# Patient Record
Sex: Female | Born: 1995 | Hispanic: Yes | Smoking: Never smoker
Health system: Southern US, Community
[De-identification: ages and names within clinical notes are randomized; demographics above are authoritative.]

## PROBLEM LIST (undated history)

## (undated) DIAGNOSIS — R519 Headache, unspecified: Secondary | ICD-10-CM

## (undated) DIAGNOSIS — Z789 Other specified health status: Secondary | ICD-10-CM

## (undated) DIAGNOSIS — F419 Anxiety disorder, unspecified: Secondary | ICD-10-CM

## (undated) HISTORY — PX: HERNIA REPAIR: SHX51

## (undated) HISTORY — PX: TONSILLECTOMY: SUR1361

## (undated) HISTORY — PX: NO PAST SURGERIES: SHX2092

## (undated) HISTORY — PX: WISDOM TOOTH EXTRACTION: SHX21

---
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
## 2019-05-01 LAB — OB RESULTS CONSOLE HEPATITIS B SURFACE ANTIGEN: Hepatitis B Surface Ag: NEGATIVE

## 2019-05-01 LAB — OB RESULTS CONSOLE VARICELLA ZOSTER ANTIBODY, IGG: Varicella: NON-IMMUNE/NOT IMMUNE

## 2019-05-01 LAB — OB RESULTS CONSOLE RUBELLA ANTIBODY, IGM: Rubella: IMMUNE

## 2019-09-13 NOTE — L&D Delivery Note (Signed)
Delivery Note At 1:58 AM a viable and healthy female "Hannah Hogan" was delivered via Vaginal, Spontaneous (Presentation:  LOA  ).  APGAR: 8, 9; weight pending .   Placenta status: Spontaneous, Intact.  Cord: 3 vessels with the following complications: Nuchal cord, loose, x1.   Anesthesia: None Episiotomy: None Lacerations: Periurethral Suture Repair: n/a Est. Blood Loss (mL): 250  Mom to postpartum.  Baby to Couplet care / Skin to Skin.  23yo G2P1001 at 39+5wks admitted in active labor. AROM for clear fluid, pitocin at 85mu/min. Delivered over intact perineum with loose nuchal cord reduced at perineum. Placenta delivered spontaneously, intact. Bleeding minimal, small periurethral tear not requiring suture. Fundus firm, baby to maternal abdomen, doing well.  Christeen Douglas 11/21/2019, 2:42 AM

## 2019-10-28 LAB — OB RESULTS CONSOLE RPR: RPR: NONREACTIVE

## 2019-10-28 LAB — OB RESULTS CONSOLE HIV ANTIBODY (ROUTINE TESTING): HIV: NONREACTIVE

## 2019-10-28 LAB — OB RESULTS CONSOLE GBS: GBS: NEGATIVE

## 2019-10-28 LAB — OB RESULTS CONSOLE GC/CHLAMYDIA
Chlamydia: NEGATIVE
Gonorrhea: NEGATIVE

## 2019-11-20 ENCOUNTER — Inpatient Hospital Stay
Admission: EM | Admit: 2019-11-20 | Discharge: 2019-11-22 | DRG: 807 | Disposition: A | Discharge status: Home / Self Care | Payer: Medicaid Other | Attending: Obstetrics and Gynecology | Admitting: Obstetrics and Gynecology

## 2019-11-20 ENCOUNTER — Other Ambulatory Visit: Payer: Self-pay

## 2019-11-20 DIAGNOSIS — Z20822 Contact with and (suspected) exposure to covid-19: Secondary | ICD-10-CM | POA: Diagnosis present

## 2019-11-20 DIAGNOSIS — Z3A39 39 weeks gestation of pregnancy: Secondary | ICD-10-CM | POA: Diagnosis not present

## 2019-11-20 DIAGNOSIS — O26893 Other specified pregnancy related conditions, third trimester: Secondary | ICD-10-CM | POA: Diagnosis present

## 2019-11-20 DIAGNOSIS — Z6791 Unspecified blood type, Rh negative: Secondary | ICD-10-CM | POA: Diagnosis not present

## 2019-11-20 DIAGNOSIS — Z23 Encounter for immunization: Secondary | ICD-10-CM

## 2019-11-20 DIAGNOSIS — Z3493 Encounter for supervision of normal pregnancy, unspecified, third trimester: Secondary | ICD-10-CM

## 2019-11-20 LAB — CBC
HCT: 39.7 % (ref 36.0–46.0)
Hemoglobin: 13.4 g/dL (ref 12.0–15.0)
MCH: 28.8 pg (ref 26.0–34.0)
MCHC: 33.8 g/dL (ref 30.0–36.0)
MCV: 85.4 fL (ref 80.0–100.0)
Platelets: 259 10*3/uL (ref 150–400)
RBC: 4.65 MIL/uL (ref 3.87–5.11)
RDW: 14 % (ref 11.5–15.5)
WBC: 6.7 10*3/uL (ref 4.0–10.5)
nRBC: 0 % (ref 0.0–0.2)

## 2019-11-20 LAB — ABO/RH: ABO/RH(D): O NEG

## 2019-11-20 LAB — TYPE AND SCREEN
ABO/RH(D): O NEG
Antibody Screen: NEGATIVE

## 2019-11-20 LAB — RESPIRATORY PANEL BY RT PCR (FLU A&B, COVID)
Influenza A by PCR: NEGATIVE
Influenza B by PCR: NEGATIVE
SARS Coronavirus 2 by RT PCR: NEGATIVE

## 2019-11-20 LAB — RAPID HIV SCREEN (HIV 1/2 AB+AG)
HIV 1/2 Antibodies: NONREACTIVE
HIV-1 P24 Antigen - HIV24: NONREACTIVE

## 2019-11-20 MED ORDER — CALCIUM CARBONATE ANTACID 500 MG PO CHEW
4.0000 | CHEWABLE_TABLET | Freq: Every day | ORAL | Status: DC
  Filled 2019-11-20 (×2): qty 4

## 2019-11-20 MED ORDER — LACTATED RINGERS IV SOLN
INTRAVENOUS | Status: DC
  Administered 2019-11-20: 999 mL via INTRAVENOUS

## 2019-11-20 MED ORDER — CALCIUM CARBONATE ANTACID 500 MG PO CHEW
CHEWABLE_TABLET | ORAL | Status: AC
  Administered 2019-11-20: 800 mg via ORAL
  Filled 2019-11-20: qty 4

## 2019-11-20 MED ORDER — OXYTOCIN 40 UNITS IN NORMAL SALINE INFUSION - SIMPLE MED
1.0000 m[IU]/min | INTRAVENOUS | Status: DC
  Administered 2019-11-20: 2 m[IU]/min via INTRAVENOUS
  Filled 2019-11-20: qty 1000

## 2019-11-20 MED ORDER — BUTORPHANOL TARTRATE 1 MG/ML IJ SOLN
1.0000 mg | INTRAMUSCULAR | Status: DC | PRN
  Administered 2019-11-21: 1 mg via INTRAVENOUS
  Filled 2019-11-20: qty 1

## 2019-11-20 MED ORDER — OXYTOCIN 40 UNITS IN NORMAL SALINE INFUSION - SIMPLE MED: 2.5000 [IU]/h | INTRAVENOUS | Status: DC

## 2019-11-20 MED ORDER — ONDANSETRON HCL 4 MG/2ML IJ SOLN: 4.0000 mg | Freq: Four times a day (QID) | INTRAMUSCULAR | Status: DC | PRN

## 2019-11-20 MED ORDER — LIDOCAINE HCL (PF) 1 % IJ SOLN: 30.0000 mL | INTRAMUSCULAR | Status: DC | PRN

## 2019-11-20 MED ORDER — ACETAMINOPHEN 325 MG PO TABS: 650.0000 mg | ORAL_TABLET | ORAL | Status: DC | PRN

## 2019-11-20 MED ORDER — LACTATED RINGERS IV SOLN: 500.0000 mL | INTRAVENOUS | Status: DC | PRN

## 2019-11-20 MED ORDER — OXYTOCIN BOLUS FROM INFUSION
500.0000 mL | Freq: Once | INTRAVENOUS | Status: AC
  Administered 2019-11-21: 500 mL via INTRAVENOUS

## 2019-11-20 MED ORDER — SOD CITRATE-CITRIC ACID 500-334 MG/5ML PO SOLN: 30.0000 mL | ORAL | Status: DC | PRN

## 2019-11-20 NOTE — Progress Notes (Signed)
Patient ID: Hannah Hogan, female   DOB: April 16, 1996, 25 y.o.   MRN: [id number]  Pt interested in dinner, then as natural a birth as possible. Plan for AROM after food, with ambulation and birthing ball and alternative labor positions PRN.

## 2019-11-20 NOTE — H&P (Signed)
OB ADMISSION/ HISTORY & PHYSICAL:  Admission Date: 11/20/2019  4:46 PM  Admit Diagnosis: Vaginal bleeding, contractions  Hannah Hogan is a 24 y.o. G2P1001 presenting for contractions.  Prenatal History: G2P1001   EDC : 11/23/2019, by Last Menstrual Period  [facility] at [facility] Prenatal course complicated by  - 1. RH negative  Rhogam 09/03/19 2. Prepregnancy BMI 38.7  Baseline CMP and P/C ratio wnl, early GTT: 87, early A1c: 5.3  Discussed total wt gain 10-15lbs 3. Varicella non immune  vaccinate post partum   Medical / Surgical History :  Past medical history: History reviewed. No pertinent past medical history.   Past surgical history: History reviewed. No pertinent surgical history.  Family History: History reviewed. No pertinent family history.   Social History:  reports that she has never smoked. She has never used smokeless tobacco. No history on file for alcohol and drug.   Allergies: Patient has no known allergies.    Current Medications at time of admission:  Prior to Admission medications   Not on File     Review of Systems: Active FM  LOF  / SROM: intact bloody show : yes   Physical Exam:  VS: Blood pressure (!) 110/59, pulse 96, temperature 99.2 F (37.3 C), temperature source Oral, resp. rate 12, last menstrual period 02/16/2019.  General: alert and oriented, appears NAD Heart: RRR Lungs: Clear lung fields Abdomen: Gravid, soft and non-tender, non-distended Extremities:  edema  FHT: 130, moderate variability, +accels, no decels TOCO: q4 min SVE:  Dilation: 4 / Effacement (%): 60 / Station: -1    Cephalic by leopolds  Prenatal Labs: Blood type/Rh   O negative  Antibody screen neg  Rubella Immune  Varicella NON- Immune  RPR NR  HBsAg Neg  HIV NR  GC neg  Chlamydia neg  Genetic screening negative  1 hour GTT 133  3 hour GTT n/a  GBS neg   No results found.  Assessment: [redacted]w[redacted]d weeks gestation 1 stage of  labor FHR category 1   Plan:  Admit for active labor Labs pending Epidural when desired Continuous fetal monitoring   1. Fetal Well being  - Fetal Tracing: cat I - Ultrasound:  reviewed, as above - Group B Streptococcus: neg - Presentation: vtx confirmed by Leopolds   2. Routine OB: - Prenatal labs reviewed, as above - Rh neg  3. Post Partum Planning: - Infant feeding: breast - Contraception: undecided

## 2019-11-20 NOTE — OB Triage Note (Signed)
Pt c/o vaginal bleeding that is dark in color and mucous like when she wipes.

## 2019-11-20 NOTE — Progress Notes (Signed)
   Cat I strip AROM for clear fluid Significant bloody show Contractions q 3-5 min Dilation: 4.5 Effacement (%): 60 Cervical Position: Middle Station: -1 Presentation: Vertex Exam by:: [employer] pitocin at 2 and up by 1

## 2019-11-21 ENCOUNTER — Encounter: Payer: Self-pay | Admitting: Obstetrics and Gynecology

## 2019-11-21 LAB — CBC
HCT: 37.2 % (ref 36.0–46.0)
Hemoglobin: 12.6 g/dL (ref 12.0–15.0)
MCH: 29.2 pg (ref 26.0–34.0)
MCHC: 33.9 g/dL (ref 30.0–36.0)
MCV: 86.1 fL (ref 80.0–100.0)
Platelets: 246 10*3/uL (ref 150–400)
RBC: 4.32 MIL/uL (ref 3.87–5.11)
RDW: 13.7 % (ref 11.5–15.5)
WBC: 15.8 10*3/uL — ABNORMAL HIGH (ref 4.0–10.5)
nRBC: 0 % (ref 0.0–0.2)

## 2019-11-21 LAB — FETAL SCREEN: Fetal Screen: NEGATIVE

## 2019-11-21 LAB — RPR: RPR Ser Ql: NONREACTIVE

## 2019-11-21 MED ORDER — ACETAMINOPHEN 325 MG PO TABS: 650.0000 mg | ORAL_TABLET | ORAL | Status: DC | PRN

## 2019-11-21 MED ORDER — RHO D IMMUNE GLOBULIN 1500 UNIT/2ML IJ SOSY
300.0000 ug | PREFILLED_SYRINGE | Freq: Once | INTRAMUSCULAR | Status: AC
  Administered 2019-11-21: 300 ug via INTRAVENOUS
  Filled 2019-11-21: qty 2

## 2019-11-21 MED ORDER — ZOLPIDEM TARTRATE 5 MG PO TABS: 5.0000 mg | ORAL_TABLET | Freq: Every evening | ORAL | Status: DC | PRN

## 2019-11-21 MED ORDER — LORATADINE 10 MG PO TABS
10.0000 mg | ORAL_TABLET | Freq: Every day | ORAL | Status: DC | PRN
  Administered 2019-11-21: 10 mg via ORAL
  Filled 2019-11-21 (×2): qty 1

## 2019-11-21 MED ORDER — SENNOSIDES-DOCUSATE SODIUM 8.6-50 MG PO TABS
2.0000 | ORAL_TABLET | ORAL | Status: DC
  Administered 2019-11-22: 2 via ORAL
  Filled 2019-11-21: qty 2

## 2019-11-21 MED ORDER — SODIUM CHLORIDE 0.9 % IV SOLN: 250.0000 mL | INTRAVENOUS | Status: DC | PRN

## 2019-11-21 MED ORDER — MEASLES, MUMPS & RUBELLA VAC IJ SOLR
0.5000 mL | Freq: Once | INTRAMUSCULAR | Status: DC
  Filled 2019-11-21: qty 0.5

## 2019-11-21 MED ORDER — BISACODYL 10 MG RE SUPP: 10.0000 mg | Freq: Every day | RECTAL | Status: DC | PRN

## 2019-11-21 MED ORDER — SIMETHICONE 80 MG PO CHEW: 80.0000 mg | CHEWABLE_TABLET | ORAL | Status: DC | PRN

## 2019-11-21 MED ORDER — WITCH HAZEL-GLYCERIN EX PADS: 1.0000 "application " | MEDICATED_PAD | CUTANEOUS | Status: DC | PRN

## 2019-11-21 MED ORDER — BENZOCAINE-MENTHOL 20-0.5 % EX AERO
1.0000 "application " | INHALATION_SPRAY | CUTANEOUS | Status: DC | PRN
  Filled 2019-11-21: qty 56

## 2019-11-21 MED ORDER — SODIUM CHLORIDE 0.9% FLUSH: 3.0000 mL | Freq: Two times a day (BID) | INTRAVENOUS | Status: DC

## 2019-11-21 MED ORDER — DIPHENHYDRAMINE HCL 25 MG PO CAPS: 25.0000 mg | ORAL_CAPSULE | Freq: Four times a day (QID) | ORAL | Status: DC | PRN

## 2019-11-21 MED ORDER — COCONUT OIL OIL
1.0000 "application " | TOPICAL_OIL | Status: DC | PRN
  Administered 2019-11-21: 1 via TOPICAL
  Filled 2019-11-21: qty 120

## 2019-11-21 MED ORDER — IBUPROFEN 600 MG PO TABS
600.0000 mg | ORAL_TABLET | Freq: Four times a day (QID) | ORAL | Status: DC
  Administered 2019-11-21 – 2019-11-22 (×6): 600 mg via ORAL
  Filled 2019-11-21 (×6): qty 1

## 2019-11-21 MED ORDER — OXYCODONE HCL 5 MG PO TABS: 5.0000 mg | ORAL_TABLET | ORAL | Status: DC | PRN

## 2019-11-21 MED ORDER — FLEET ENEMA 7-19 GM/118ML RE ENEM: 1.0000 | ENEMA | Freq: Every day | RECTAL | Status: DC | PRN

## 2019-11-21 MED ORDER — ONDANSETRON HCL 4 MG PO TABS: 4.0000 mg | ORAL_TABLET | ORAL | Status: DC | PRN

## 2019-11-21 MED ORDER — SODIUM CHLORIDE 0.9% FLUSH: 3.0000 mL | INTRAVENOUS | Status: DC | PRN

## 2019-11-21 MED ORDER — DIBUCAINE (PERIANAL) 1 % EX OINT: 1.0000 "application " | TOPICAL_OINTMENT | CUTANEOUS | Status: DC | PRN

## 2019-11-21 MED ORDER — ONDANSETRON HCL 4 MG/2ML IJ SOLN: 4.0000 mg | INTRAMUSCULAR | Status: DC | PRN

## 2019-11-21 MED ORDER — TETANUS-DIPHTH-ACELL PERTUSSIS 5-2.5-18.5 LF-MCG/0.5 IM SUSP: 0.5000 mL | Freq: Once | INTRAMUSCULAR | Status: DC

## 2019-11-21 MED ORDER — PRENATAL MULTIVITAMIN CH
1.0000 | ORAL_TABLET | Freq: Every day | ORAL | Status: DC
  Administered 2019-11-21 – 2019-11-22 (×2): 1 via ORAL
  Filled 2019-11-21 (×2): qty 1

## 2019-11-21 NOTE — Lactation Note (Signed)
This note was copied from a baby's chart. Lactation Consultation Note  Patient Name: Hannah Hogan [id number] Date: 11/21/2019 Reason for consult: Initial assessment  Infant laying in bassinet when [facility] student entered the room. Infant started showing hunger cues in the bassinet and LC student offered to assist with latch.  MOB reported cracking on both breast and bleeding on right breast. Reported hx of breastfeeding with almost 23 yo child and goal is to pump milk for this child in a few months when she is separated from baby for her schooling.  LC student provided education re: cluster feeding, importance of proper positioning, wide open mouth, baby's body alignment, normal newborn behavior and normalized no pain.  MOB knows to call LC, PRN.   Maternal Data Formula Feeding for Exclusion: No Has patient been taught Hand Expression?: Yes Does the patient have breastfeeding experience prior to this delivery?: Yes  Feeding Feeding Type: Breast Fed  LATCH Score Latch: Repeated attempts needed to sustain latch, nipple held in mouth throughout feeding, stimulation needed to elicit sucking reflex.  Audible Swallowing: Spontaneous and intermittent  Type of Nipple: Flat  Comfort (Breast/Nipple): Filling, red/small blisters or bruises, mild/mod discomfort  Hold (Positioning): Assistance needed to correctly position infant at breast and maintain latch.  LATCH Score: 6  Interventions Interventions: Breast feeding basics reviewed;Assisted with latch;Skin to skin;Comfort gels;Position options;Support pillows  Lactation Tools Discussed/Used Tools: Comfort gels   Consult Status Consult Status: Follow-up Follow-up type: In-patient    Carlena Hurl 11/21/2019, 11:55 AM

## 2019-11-21 NOTE — Discharge Summary (Signed)
Obstetrical Discharge Summary  Patient Name: Hannah Hogan DOB: 1995-10-05 MRN: [id number]  Date of Admission: 11/20/2019 Date of Discharge: 11/22/2019  Primary OB: [facility]   Gestational Age at Delivery: [redacted]w[redacted]d   Antepartum complications:   - 1. RH negative  Rhogam 09/03/19 2. Prepregnancy BMI 38.7  Baseline CMP and P/C ratio wnl, early GTT: 87, early A1c: 5.3  Discussed total wt gain 10-15lbs 3. Varicella non immune  vaccinate post partum  Admitting Diagnosis: labor Secondary Diagnosis: Patient Active Problem List   Diagnosis Date Noted  . Supervision of normal pregnancy in third trimester 11/20/2019    Augmentation: AROM and Pitocin Complications: None Intrapartum complications/course: 62WE G2P1001 at 39+5wks admitted in active labor. AROM for clear fluid, pitocin at 4mu/min. Delivered over intact perineum with loose nuchal cord reduced at perineum. Placenta delivered spontaneously, intact. Bleeding minimal, small periurethral tear not requiring suture. Fundus firm, baby to maternal abdomen, doing well.  Date of Delivery: 11/21/2019 Delivered By: Benjaman Kindler  Delivery Type: spontaneous vaginal delivery Anesthesia: none Placenta: Spontaneous Laceration: small Episiotomy: none Newborn Data: Live born female "Hannah Hogan" Birth Weight: 4240g  APGAR: 8, 9  Newborn Delivery   Birth date/time: 11/21/2019 01:58:00 Delivery type: Vaginal, Spontaneous      Brief Hospital Course  Hannah Hogan is a G2P1001 who had a SVD on 11/21/2019;  for further details of this delivery, please refer to the delivery note.  Patient had an uncomplicated postpartum course.  By time of discharge on PPD#1, her pain was controlled on oral pain medications; she had appropriate lochia and was ambulating, voiding without difficulty and tolerating regular diet.  She was deemed stable for discharge to home.    Discharge Physical Exam:  BP (!) 91/53 (BP Location:  Left Arm)   Pulse 77   Temp 98 F (36.7 C) (Oral)   Resp 18   Ht 5\' 3"  (1.6 m)   Wt 111.1 kg   LMP 02/16/2019   SpO2 99%   Breastfeeding Unknown   BMI 43.40 kg/m   General: NAD CV: RRR Pulm: CTABL, nl effort ABD: s/nd/nt, fundus firm and below the umbilicus Lochia: moderate DVT Evaluation: LE non-ttp, no evidence of DVT on exam.  Hemoglobin  Date Value Ref Range Status  11/22/2019 11.8 (L) 12.0 - 15.0 g/dL Final   HGB  Date Value Ref Range Status  01/17/2013 13.0 12.0 - 16.0 g/dL Final   HCT  Date Value Ref Range Status  11/22/2019 35.9 (L) 36.0 - 46.0 % Final  01/17/2013 38.1 35.0 - 47.0 % Final    Postpartum Procedures: Varicella ordered to be given Disposition: stable, discharge to home. Baby Feeding: breastmilk Baby Disposition: home with mom  Rh Immune globulin given: 11/21/2019 Rubella vaccine given: n/a   Contraception: undecided  Prenatal Labs: Blood type/Rh --/--/O NEG Performed at [facility], [address] [place], [place] [address]  [phone])  Antibody screen neg  Rubella Immune  Varicella NON-Immune  RPR NR  HBsAg Neg  HIV NR  GC neg  Chlamydia neg  Genetic screening negative  1 hour GTT 133  3 hour GTT n/a  GBS neg     Plan:  Jamal Maes was discharged to home in good condition. Follow-up appointment at [facility]  with delivering provider in 4-6 weeks   Discharge Medications: Allergies as of 11/22/2019   No Known Allergies     Medication List    TAKE these medications   acetaminophen 325 MG tablet  Commonly known as: TYLENOL Take 2 tablets (650 mg total) by mouth every 4 (four) hours as needed for mild pain or moderate pain.   ibuprofen 600 MG tablet Commonly known as: ADVIL Take 1 tablet (600 mg total) by mouth every 6 (six) hours as needed for mild pain, moderate pain or cramping.   prenatal multivitamin Tabs tablet Take 1 tablet by mouth daily at 12 noon.        Follow-up Information    Christeen Douglas, MD. Schedule an appointment as soon as possible for a visit in 4 week(s).   Specialty: Obstetrics and Gynecology Why: For routine postpartum visit in 4-6 weeks Contact information: [address] [place] [address] [phone]           Signed: Genia Del, CNM 11/22/2019 10:26 AM

## 2019-11-21 NOTE — Progress Notes (Signed)
Post Partum Day 0  Subjective: Doing well, no concerns. Ambulating without difficulty, pain managed with PO meds, tolerating regular diet, and voiding without difficulty.   No fever/chills, chest pain, shortness of breath, nausea/vomiting, or leg pain. No nipple or breast pain. \  Objective: BP (!) 98/54 (BP Location: Right Arm) Comment: nurse Kelvin Cellar notified  Pulse 83   Temp 98.7 F (37.1 C) (Oral)   Resp 20   Ht 5\' 3"  (1.6 m)   Wt 111.1 kg   LMP 02/16/2019   SpO2 100%   Breastfeeding Unknown   BMI 43.40 kg/m    Physical Exam:  General: alert and cooperative Breasts: soft, with nipple abrasions/excoriation CV: RRR Pulm: nl effort, CTABL Abdomen: soft, non-tender, active bowel sounds Uterine Fundus: firm Incision: n/a Perineum: minimal edema, lacerations hemostatic, repair well approximated Lochia: appropriate DVT Evaluation: No evidence of DVT seen on physical exam.  Recent Labs    11/20/19 1741 11/21/19 0446  HGB 13.4 12.6  HCT 39.7 37.2  WBC 6.7 15.8*  PLT 259 246    Assessment/Plan: 24 y.o. G2P2002 postpartum day # 0  -Continue routine postpartum care -Lactation consult needed for breastfeeding   -Immunization status: Needs varicella, prior to discharge  Disposition: Continue inpatient postpartum care    LOS: 1 day   Nalayah Hitt, CNM 11/21/2019, 7:55 AM

## 2019-11-22 LAB — RHOGAM INJECTION: Unit division: 0

## 2019-11-22 LAB — CBC
HCT: 35.9 % — ABNORMAL LOW (ref 36.0–46.0)
Hemoglobin: 11.8 g/dL — ABNORMAL LOW (ref 12.0–15.0)
MCH: 28.6 pg (ref 26.0–34.0)
MCHC: 32.9 g/dL (ref 30.0–36.0)
MCV: 87.1 fL (ref 80.0–100.0)
Platelets: 217 K/uL (ref 150–400)
RBC: 4.12 MIL/uL (ref 3.87–5.11)
RDW: 14.5 % (ref 11.5–15.5)
WBC: 7.6 K/uL (ref 4.0–10.5)
nRBC: 0 % (ref 0.0–0.2)

## 2019-11-22 MED ORDER — ACETAMINOPHEN 325 MG PO TABS: 650.0000 mg | ORAL_TABLET | ORAL | Status: DC | PRN

## 2019-11-22 MED ORDER — INFLUENZA VAC SPLIT QUAD 0.5 ML IM SUSY: 0.5000 mL | PREFILLED_SYRINGE | INTRAMUSCULAR | Status: DC

## 2019-11-22 MED ORDER — IBUPROFEN 600 MG PO TABS: 600.0000 mg | ORAL_TABLET | Freq: Four times a day (QID) | ORAL | 0 refills | Status: DC | PRN

## 2019-11-22 MED ORDER — VARICELLA VIRUS VACCINE LIVE 1350 PFU/0.5ML IJ SUSR
0.5000 mL | Freq: Once | INTRAMUSCULAR | Status: AC
  Administered 2019-11-22: 0.5 mL via SUBCUTANEOUS
  Filled 2019-11-22 (×2): qty 0.5

## 2019-11-22 MED ORDER — PRENATAL MULTIVITAMIN CH: 1.0000 | ORAL_TABLET | Freq: Every day | ORAL | Status: DC

## 2019-11-22 MED ORDER — INFLUENZA VAC SPLIT QUAD 0.5 ML IM SUSY
0.5000 mL | PREFILLED_SYRINGE | INTRAMUSCULAR | Status: AC
  Administered 2019-11-22: 0.5 mL via INTRAMUSCULAR
  Filled 2019-11-22: qty 0.5

## 2019-11-22 NOTE — Lactation Note (Addendum)
This note was copied from a baby's chart. Lactation Consultation Note  Patient Name: Hannah Hogan [id number] Date: 11/22/2019 Reason for consult: Follow-up assessment   Maternal Data Formula Feeding for Exclusion: No Does the patient have breastfeeding experience prior to this delivery?: Yes  Feeding Feeding Type: Breast Fed Baby latches easily to breast, nipple pain decreased with finger pressure on chin  LATCH Score Latch: Grasps breast easily, tongue down, lips flanged, rhythmical sucking.  Audible Swallowing: Spontaneous and intermittent  Type of Nipple: Everted at rest and after stimulation  Comfort (Breast/Nipple): Filling, red/small blisters or bruises, mild/mod discomfort  Hold (Positioning): No assistance needed to correctly position infant at breast.  LATCH Score: 9  Interventions Interventions: Breast compression;Adjust position  Lactation Tools Discussed/Used  mom to continue to adjust baby's latch to decrease pain on latch, coconut oil for soreness as needed  Consult Status Consult Status: Complete Date: 11/22/19 Follow-up type: In-patient    Dyann Kief 11/22/2019, 11:06 AM

## 2019-11-22 NOTE — Progress Notes (Signed)
Pt discharged with infant.  Discharge instructions, prescriptions and follow up appointment given to and reviewed with pt. Pt verbalized understanding. Escorted out by staff. 

## 2019-11-22 NOTE — Discharge Instructions (Signed)
Breastfeeding and Cracked or Sore Nipples It is normal to have some tenderness in your nipples when you start to breastfeed your new baby. Your nipples can also become cracked or sore. This may happen if your baby or the baby's mouth is not in the right position when breastfeeding. There are things you can do to help avoid these problems. Follow these instructions at home: Breastfeeding strategy   Make sure your baby's mouth attaches to your nipple (latches) properly to breastfeed.  Make sure your baby is in the right position when breastfeeding. Try different positions to find one that works.  Have your baby feed from the less sore breast first.  Break the latch between your baby's mouth and your nipple before removing him or her from your breast. To do this: ? Put your little finger in between your nipple and your baby's gums. If you use a breast pump:  Make sure the part of the pump that goes over your nipple fits properly.  Start by setting the pump to a low setting. Increase the pump strength over time as needed. Too high of a setting may cause damage to your nipple. Breast care To help your breasts and nipples stay healthy:  Avoid the use of soap on your nipples.  Wear a supportive bra. Avoid wearing underwire bras or tight bras.  Air-dry your nipples for 3-4 minutes after each feeding.  Use only cotton bra pads to soak up any breast milk that leaks. Be sure to change the pads if they become soaked with milk.  Put some lanolin on your nipples after breastfeeding. Pure lanolin does not need to be washed off your nipple before you feed your baby again. Pure lanolin is not harmful to your baby.  Rub some breast milk into your nipples: ? Use your hand to squeeze out a few drops of breast milk. ? Gently massage the milk into your nipples. ? Let your nipples air-dry. Contact a doctor if:  You have nipple pain.  You have soreness or cracking that lasts more than 1  week. Summary  It is normal to have some tenderness in your nipples when you start to breastfeed your new baby. But you should contact your doctor if you have nipple pain.  Your nipples can become cracked or sore if your baby's mouth does not attach to your nipple properly when breastfeeding.  Rub lanolin or breast milk onto your nipples to keep them from getting cracked or sore. Avoid washing your nipples with soap. This information is not intended to replace advice given to you by your health care provider. Make sure you discuss any questions you have with your health care provider. Document Revised: 12/19/2018 Document Reviewed: 04/06/2017 Elsevier Patient Education  2020 [employer]. Breastfeeding Tips for a Good Latch Latching is how your baby's mouth attaches to your nipple to breastfeed. It is an important part of breastfeeding. Your baby may have trouble latching for a number of reasons. A poor latch may cause you to have cracked or sore nipples or other problems. Follow these instructions at home: How to position your baby  Find a comfortable place to sit or lie down. Your neck and back should be well supported.  If you are seated, place a pillow or rolled-up blanket under your baby. This will bring him or her to the level of your breast.  Make sure that your baby's belly (abdomen) is facing your belly.  Try different positions to find one that works best  for you and your baby. How to help your baby latch   To start, gently rub your breast. Move your fingertips in a circle as you massage from your chest wall toward your nipple. This helps milk flow. Keep doing this during feeding if needed.  Position your breast. Hold your breast with four fingers underneath and your thumb above your nipple. Keep your fingers away from your nipple and your baby's mouth. Follow these steps to help your baby latch: 1. Rub your baby's lips gently with your finger or nipple. 2. When your baby's  mouth is open wide enough, quickly bring your baby to your breast and place your whole nipple into your baby's mouth. Place as much of the colored area around your nipple (areola)as possible into your baby's mouth. 3. Your baby's tongue should be between his or her lower gum and your breast. 4. You should be able to see more areola above your baby's upper lip than below the lower lip. 5. When your baby starts sucking, you will feel a gentle pull on your nipple. You should not feel any pain. Be patient. It is common for a baby to suck for about 2-3 minutes to start the flow of breast milk. 6. Make sure that your baby's mouth is in the right position around your nipple. Your baby's lips should make a seal on your breast and be turned outward.  General instructions  Look for these signs that your baby has latched on to your nipple: ? The baby is quietly tugging or sucking without causing you pain. ? You hear the baby swallow after every 3 or 4 sucks. ? You see movement above and in front of the baby's ears while he or she is sucking.  Be aware of these signs that your baby has not latched on to your nipple: ? The baby makes sucking sounds or smacking sounds while feeding. ? You have nipple pain.  If your baby is not latched well, put your little finger between your baby's gums and your nipple. This will break the seal. Then try to help your baby latch again.  If you keep having problems, get help from a breastfeeding specialist (Advertising copywriter). Contact a doctor if:  You have cracking or soreness in your nipples that lasts longer than 1 week.  You have nipple pain.  Your breasts are filled with too much milk (engorgement), and this does not improve after 48-72 hours.  You have a plugged milk duct and a fever.  You follow the tips for a good latch but you keep having problems or concerns.  You have a pus-like fluid coming from your breast.  Your baby is not gaining weight.  Your  baby loses weight. Summary  Latching is how your baby's mouth attaches to your nipple to breastfeed.  Try different positions for breastfeeding to find one that works best for you and your baby.  A poor latch may cause you to have cracked or sore nipples or other problems. This information is not intended to replace advice given to you by your health care provider. Make sure you discuss any questions you have with your health care provider. Document Revised: 12/19/2018 Document Reviewed: 04/05/2017 Elsevier Patient Education  2020 [employer]. After Your Delivery Discharge Instructions   Postpartum: Care Instructions  After childbirth (postpartum period), your body goes through many changes. Some of these changes happen over several weeks. In the hours after delivery, your body will begin to recover from childbirth while  it prepares to breastfeed your newborn. You may feel emotional during this time. Your hormones can shift your mood without warning for no clear reason.  In the first couple of weeks after childbirth, many women have emotions that change from happy to sad. You may find it hard to sleep. You may cry a lot. This is called the "baby blues." These overwhelming emotions often go away within a couple of days or weeks. But it's important to discuss your feelings with your doctor.  You should call your care provider if you have unrelieved feelings of:  Inability to cope  Sadness  Anxiety  Lack of interest in baby  Insomnia  Crying  It is easy to get too tired and overwhelmed during the first weeks after childbirth. Don't try to do too much. Get rest whenever you can, accept help from others, and eat well and drink plenty of fluids.  About 4 to 6 weeks after your baby's birth, you will have a follow-up visit with your care provider. This visit is your time to talk to your provider about anything you are concerned or curious about.  Follow-up care is a key part of your  treatment and safety. Be sure to make and go to all appointments, and call your doctor if you are having problems. It's also a good idea to know your test results and keep a list of the medicines you take.  How can you care for yourself at home?  Sleep or rest when your baby sleeps.  Get help with household chores from family or friends, if you can. Do not try to do it all yourself.  If you have hemorrhoids or swelling or pain around the opening of your vagina, try using cold and heat. You can put ice or a cold pack on the area for 10 to 20 minutes at a time. Put a thin cloth between the ice and your skin. Also try sitting in a few inches of warm water (sitz bath) 3 times a day and after bowel movements.  Take pain medicines exactly as directed.  If the provider gave you a prescription medicine for pain, take it as prescribed.  If you do not have a prescription and need something over the counter, you can take:  Ibuprofen (Motrin, Advil) up to 600mg  every 6 hours as needed for pain  Acetaminophen (Tylenol) up to 650mg  every 4 hours as needed for pain  Some people find it helpful to alternate between these two medications.   No driving for 1-2 weeks or while taking pain medications.   Eat more fiber to avoid constipation. Include foods such as whole-grain breads and cereals, raw vegetables, raw and dried fruits, and beans.  Drink plenty of fluids, enough so that your urine is light yellow or clear like water. If you have kidney, heart, or liver disease and have to limit fluids, talk with your doctor before you increase the amount of fluids you drink.  Do not put anything in the vagina for 6 weeks. This means no sex, no tampons, no douching, and no enemas.  If you have stitches, keep the area clean by pouring or spraying warm water over the area outside your vagina and anus after you use the toilet.  No strenuous activity or heavy lifting for 6 weeks   No tub baths; showers  only  Continue prenatal vitamin and iron.  If breastfeeding:  Increase calories and fluids while breastfeeding.  You may have a slight fever when your  milk comes in, but it should go away on its own. If it does not, and rises above 101.0 please call the doctor.  For breastfeeding concerns, the lactation consultant can be reached at [phone].  For concerns about your baby, please call your pediatrician.   Keep a list of questions to bring to your postpartum visit. Your questions might be about:  Changes in your breasts, such as lumps or soreness.  When to expect your menstrual period to start again.  What form of birth control is best for you.  Weight you have put on during the pregnancy.  Exercise options.  What foods and drinks are best for you, especially if you are breastfeeding.  Problems you might be having with breastfeeding.  When you can have sex. Some women may want to talk about lubricants for the vagina.  Any feelings of sadness or restlessness that you are having.   When should you call for help?  Call 911 anytime you think you may need emergency care. For example, call if:  You have thoughts of harming yourself, your baby, or another person.  You passed out (lost consciousness).  Call the office at [phone] or seek immediate medical care if:  If you have heavy bleeding such that you are soaking 1 pad in an hour for 2 hours  You are dizzy or lightheaded, or you feel like you may faint.  You have a fever; a temperature of 101.0 F or greater  Chills  Difficulty urinating  Headache unrelieved by "pain meds"   Visual changes  Pain in the right side of your belly near your ribs  Breasts reddened, hard, hot to the touch or any other breast concerns  Nipple discharge which is foul-smelling or contains pus   New pain unrelieved with recommended over-the-counter dosages  Difficulty breathing with or without chest pain   New leg pain,  swelling, or redness, especially if it is only on one leg  Any other concerns  Watch closely for changes in your health, and be sure to contact your provider if:  You have new or worse vaginal discharge.  You feel sad or depressed.  You are having problems with your breasts or breastfeeding.

## 2020-03-17 ENCOUNTER — Ambulatory Visit: Payer: Medicaid Other | Admitting: Family Medicine

## 2020-09-12 NOTE — L&D Delivery Note (Signed)
Delivery Note  First Stage: Labor onset: 1500 Augmentation : AROM Analgesia /Anesthesia intrapartum: IVPM Fentanyl x 1 at 2039 AROM at 1811  Second Stage: Complete dilation at 2131 Onset of pushing at 2135 FHR second stage Cat I  Delivery of a viable female on 03/18/21 at 2144 by CNM delivery of fetal head in LOA position with restitution to LOT. NO nuchal cord;  Anterior then posterior shoulders delivered easily with gentle downward traction, tight body cord x 2 around waist.  Baby placed on mom's chest, and attended to by peds.  Cord double clamped after cessation of pulsation, cut by FOB Cord blood sample collected    Third Stage: Placenta delivered spontaneously intact with [facility] @ 2150 Placenta disposition: routine disposal Uterine tone Firm / bleeding scant  No cervical, perineal or vaginal laceration identified  Anesthesia for repair: no repair needed.  Est. Blood Loss (mL): 250  Complications: none  Mom to postpartum.  Baby to Couplet care / Skin to Skin.  Newborn: Birth Weight: pending  Apgar Scores: 8/9 Feeding planned: breast

## 2020-09-20 ENCOUNTER — Emergency Department: Payer: Medicaid Other

## 2020-09-20 ENCOUNTER — Emergency Department
Admission: EM | Admit: 2020-09-20 | Discharge: 2020-09-20 | Disposition: A | Discharge status: Home / Self Care | Payer: Medicaid Other | Attending: Student in an Organized Health Care Education/Training Program | Admitting: Student in an Organized Health Care Education/Training Program

## 2020-09-20 ENCOUNTER — Encounter: Payer: Self-pay | Admitting: Emergency Medicine

## 2020-09-20 ENCOUNTER — Other Ambulatory Visit: Payer: Self-pay

## 2020-09-20 DIAGNOSIS — M545 Low back pain, unspecified: Secondary | ICD-10-CM | POA: Diagnosis not present

## 2020-09-20 DIAGNOSIS — O26892 Other specified pregnancy related conditions, second trimester: Secondary | ICD-10-CM | POA: Diagnosis not present

## 2020-09-20 DIAGNOSIS — Z3A14 14 weeks gestation of pregnancy: Secondary | ICD-10-CM | POA: Diagnosis not present

## 2020-09-20 MED ORDER — CYCLOBENZAPRINE HCL 5 MG PO TABS: ORAL_TABLET | ORAL | 0 refills | Status: DC

## 2020-09-20 MED ORDER — ACETAMINOPHEN 500 MG PO TABS: 500.0000 mg | ORAL_TABLET | Freq: Four times a day (QID) | ORAL | 0 refills | Status: AC | PRN

## 2020-09-20 NOTE — ED Provider Notes (Signed)
[facility] Emergency Department Provider Note  ____________________________________________  Time seen: Approximately 4:46 PM  I have reviewed the triage vital signs and the nursing notes.   HISTORY  Chief Complaint Back Pain    HPI Hannah Hogan is a 25 y.o. female that is [redacted] weeks pregnant that presents to the emergency department for evaluation of low back pain today.  Patient states that she bent over today and immediately felt pain and tightening to both sides of her lobe back.  She had difficulty walking after due to the discomfort.  Pain is worse with walking. 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 No specific trauma.  Patient had similar back pain several months ago and had x-rays completed at that time, which were normal.  She was started on muscle relaxers and pain improved after about 2 months.  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  Patient does follow closely with OB.  She recently had labs and a urinalysis without any abnormalities.  No fevers, abdominal pain, vaginal bleeding, urinary symptoms.  History reviewed. No pertinent past medical history.  Patient Active Problem List   Diagnosis Date Noted  . Supervision of normal pregnancy in third trimester 11/20/2019    History reviewed. No pertinent surgical history.  Prior to Admission medications   Medication Sig Start Date End Date Taking? Authorizing Provider  acetaminophen (TYLENOL) 500 MG tablet Take 1 tablet (500 mg total) by mouth every 6 (six) hours as needed. 09/20/20  Yes Enid Derry, PA-C  cyclobenzaprine (FLEXERIL) 5 MG tablet Take 1-2 tablets 3 times daily as needed 09/20/20  Yes Enid Derry, PA-C  ibuprofen (ADVIL) 600 MG tablet Take 1 tablet (600 mg total) by mouth every 6 (six) hours as needed for mild pain, moderate pain or  cramping. 11/22/19   Genia Del, CNM  Prenatal Vit-Fe Fumarate-FA (PRENATAL MULTIVITAMIN) TABS tablet Take 1 tablet by mouth daily at 12 noon. 11/22/19   Genia Del, CNM    Allergies Patient has no known allergies.  History reviewed. No pertinent family history.  Social History Social History   Tobacco Use  . Smoking status: Never Smoker  . Smokeless tobacco: Never Used     Review of Systems  Constitutional: No fever/chills ENT: No upper respiratory complaints. Cardiovascular: No chest pain. Respiratory: No cough. No SOB. Gastrointestinal: No abdominal pain.  No nausea, no vomiting.  Genitourinary: Negative for dysuria, urgency, frequency. Musculoskeletal: Positive for low back discomfort. Skin: Negative for rash, abrasions, lacerations, ecchymosis. Neurological: Negative for headaches   ____________________________________________   PHYSICAL EXAM:  VITAL SIGNS: ED Triage Vitals [09/20/20 1601]  Enc Vitals Group     BP (!) 107/48     Pulse Rate 77     Resp 20     Temp 99.5 F (37.5 C)     Temp Source Oral     SpO2 100 %     Weight 210 lb (95.3 kg)     Height 5\' 3"  (1.6 m)     Head Circumference      Peak Flow      Pain Score 7     Pain Loc      Pain Edu?      Excl. in GC?      Constitutional: Alert and oriented. Well appearing and in no acute distress. Eyes: Conjunctivae  are normal. PERRL. EOMI. Head: Atraumatic. ENT:      Ears:      Nose: No congestion/rhinnorhea.      Mouth/Throat: Mucous membranes are moist.  Neck: No stridor.   Cardiovascular: Normal rate, regular rhythm.  Good peripheral circulation. Respiratory: Normal respiratory effort without tachypnea or retractions. Lungs CTAB. Good air entry to the bases with no decreased or absent breath sounds. Gastrointestinal: Bowel sounds 4 quadrants. Soft and nontender to palpation. No guarding or rigidity. No palpable masses. No distention. No CVA tenderness. Musculoskeletal: Full  range of motion to all extremities. No gross deformities appreciated. No tenderness to palpation to lumbar spine. Mild tenderness to palpation to lower lumbar paraspinal muscles. Strength equal and normal extremities bilaterally. Normal gait. Neurologic:  Normal speech and language. No gross focal neurologic deficits are appreciated.  Skin:  Skin is warm, dry and intact. No rash noted. Psychiatric: Mood and affect are normal. Speech and behavior are normal. Patient exhibits appropriate insight and judgement.   ____________________________________________   LABS (all labs ordered are listed, but only abnormal results are displayed)  Labs Reviewed - No data to display ____________________________________________  EKG   ____________________________________________  RADIOLOGY Lexine Baton, personally viewed and evaluated these images (plain radiographs) as part of my medical decision making, as well as reviewing the written report by the radiologist.  US OB Limited  Result Date: 09/20/2020 CLINICAL DATA:  Back pain over the last day.  Early pregnancy. EXAM: LIMITED OBSTETRIC ULTRASOUND FINDINGS: Number of Fetuses: 1 Heart Rate:  153 bpm Movement: Present Presentation: Variable Placental Location: Anterior Previa: None Amniotic Fluid (Subjective):  Within normal limits. BPD: 2.65 cm 14 w  5 d MATERNAL FINDINGS: Cervix:  Appears closed.  Length 3.7 cm Uterus/Adnexae: No abnormality visualized. IMPRESSION: Normal appearing early limited obstetric ultrasound. Single living intrauterine gestational at 14 weeks 5 days by biparietal diameter. No complicating features evident. This exam is performed on an emergent basis and does not comprehensively evaluate fetal size, dating, or anatomy; follow-up complete OB US should be considered if further fetal assessment is warranted. Electronically Signed   By: Paulina Fusi M.D.   On: 09/20/2020 17:48     ____________________________________________    PROCEDURES  Procedure(s) performed:    Procedures    Medications - No data to display   ____________________________________________   INITIAL IMPRESSION / ASSESSMENT AND PLAN / ED COURSE  Pertinent labs & imaging results that were available during my care of the patient were reviewed by me and considered in my medical decision making (see chart for details).  Review of the [place] CSRS was performed in accordance of the [facility] prior to dispensing any controlled drugs.    Patient presents to the emergency department for evaluation of low back pain after feeling a pull while bending over this morning. Vital signs and exam are reassuring. No indication of fracture. Ultrasound shows a normal intrauterine pregnancy without abnormality and HR of 153. Patient declines doing a urinalysis in the ED. She comes today for a work note so she can stay home tonight and rest. She will follow up with OB this week. Patient will be discharged home with prescriptions for a short course of Flexeril and Tylenol. Patient is to follow up with OB as directed. Patient is given ED precautions to return to the ED for any worsening or new symptoms.   Hannah Hogan was evaluated in Emergency Department on 09/20/2020 for the symptoms described in the history of present illness. She was  evaluated in the context of the global COVID-19 pandemic, which necessitated consideration that the patient might be at risk for infection with the SARS-CoV-2 virus that causes COVID-19. Institutional protocols and algorithms that pertain to the evaluation of patients at risk for COVID-19 are in a state of rapid change based on information released by regulatory bodies including the CDC and federal and state organizations. These policies and algorithms were followed during the patient's care in the ED.   ____________________________________________  FINAL CLINICAL  IMPRESSION(S) / ED DIAGNOSES  Final diagnoses:  Acute bilateral low back pain without sciatica      NEW MEDICATIONS STARTED DURING THIS VISIT:  ED Discharge Orders         Ordered    cyclobenzaprine (FLEXERIL) 5 MG tablet        09/20/20 1818    acetaminophen (TYLENOL) 500 MG tablet  Every 6 hours PRN        09/20/20 1818              This chart was dictated using voice recognition software/Dragon. Despite best efforts to proofread, errors can occur which can change the meaning. Any change was purely unintentional.    Enid Derry, PA-C 09/20/20 1859    Jene Every, MD 09/20/20 [employer]y [employer]s

## 2020-09-20 NOTE — ED Triage Notes (Signed)
Pt states bent over this AM to pick something up and felt a pop in her back, pt states worsening pain since then, pt states pain worsening with movement at this time.   Pt also states is [redacted] weeks pregnant. Pt states is G3P2L2.

## 2020-09-20 NOTE — Discharge Instructions (Signed)
Please call OB or primary care tomorrow for a follow up appointment within 48 hours. Return to the emergency department for any change or worsening of symptoms.

## 2020-09-20 NOTE — ED Notes (Signed)
Attempted FHT in triage, unable to complete at this time.

## 2020-09-20 NOTE — ED Notes (Signed)
Patient is not in room for discharge.

## 2020-11-02 ENCOUNTER — Other Ambulatory Visit: Payer: Self-pay | Admitting: Obstetrics and Gynecology

## 2020-11-02 DIAGNOSIS — N644 Mastodynia: Secondary | ICD-10-CM

## 2020-11-02 DIAGNOSIS — N6011 Diffuse cystic mastopathy of right breast: Secondary | ICD-10-CM

## 2020-11-12 ENCOUNTER — Other Ambulatory Visit: Payer: Medicaid Other

## 2021-03-17 ENCOUNTER — Other Ambulatory Visit: Payer: Self-pay | Admitting: Obstetrics and Gynecology
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
## 2021-03-18 ENCOUNTER — Inpatient Hospital Stay
Admission: EM | Admit: 2021-03-18 | Discharge: 2021-03-20 | DRG: 807 | Disposition: A | Discharge status: Home / Self Care | Payer: BC Managed Care – PPO

## 2021-03-18 ENCOUNTER — Other Ambulatory Visit: Payer: Self-pay

## 2021-03-18 DIAGNOSIS — Z6791 Unspecified blood type, Rh negative: Secondary | ICD-10-CM | POA: Diagnosis not present

## 2021-03-18 DIAGNOSIS — O99214 Obesity complicating childbirth: Secondary | ICD-10-CM | POA: Diagnosis present

## 2021-03-18 DIAGNOSIS — O99344 Other mental disorders complicating childbirth: Secondary | ICD-10-CM | POA: Diagnosis present

## 2021-03-18 DIAGNOSIS — F419 Anxiety disorder, unspecified: Secondary | ICD-10-CM | POA: Diagnosis present

## 2021-03-18 DIAGNOSIS — Z3A39 39 weeks gestation of pregnancy: Secondary | ICD-10-CM

## 2021-03-18 DIAGNOSIS — O403XX0 Polyhydramnios, third trimester, not applicable or unspecified: Secondary | ICD-10-CM | POA: Diagnosis present

## 2021-03-18 DIAGNOSIS — Z20822 Contact with and (suspected) exposure to covid-19: Secondary | ICD-10-CM | POA: Diagnosis present

## 2021-03-18 DIAGNOSIS — O26893 Other specified pregnancy related conditions, third trimester: Secondary | ICD-10-CM | POA: Diagnosis present

## 2021-03-18 DIAGNOSIS — F32A Depression, unspecified: Secondary | ICD-10-CM | POA: Diagnosis present

## 2021-03-18 HISTORY — DX: Other specified health status: Z78.9

## 2021-03-18 LAB — TYPE AND SCREEN
ABO/RH(D): O NEG
Antibody Screen: NEGATIVE

## 2021-03-18 LAB — COMPREHENSIVE METABOLIC PANEL
ALT: 14 U/L (ref 0–44)
AST: 19 U/L (ref 15–41)
Albumin: 2.7 g/dL — ABNORMAL LOW (ref 3.5–5.0)
Alkaline Phosphatase: 166 U/L — ABNORMAL HIGH (ref 38–126)
Anion gap: 6 (ref 5–15)
BUN: 9 mg/dL (ref 6–20)
CO2: 21 mmol/L — ABNORMAL LOW (ref 22–32)
Calcium: 9 mg/dL (ref 8.9–10.3)
Chloride: 108 mmol/L (ref 98–111)
Creatinine, Ser: 0.62 mg/dL (ref 0.44–1.00)
GFR, Estimated: >60 mL/min (ref >60)
Glucose, Bld: 102 mg/dL — ABNORMAL HIGH (ref 70–99)
Potassium: 3.8 mmol/L (ref 3.5–5.1)
Sodium: 135 mmol/L (ref 135–145)
Total Bilirubin: 0.5 mg/dL (ref 0.3–1.2)
Total Protein: 6.3 g/dL — ABNORMAL LOW (ref 6.5–8.1)

## 2021-03-18 LAB — CBC
HCT: 35.3 % — ABNORMAL LOW (ref 36.0–46.0)
Hemoglobin: 11.9 g/dL — ABNORMAL LOW (ref 12.0–15.0)
MCH: 29.2 pg (ref 26.0–34.0)
MCHC: 33.7 g/dL (ref 30.0–36.0)
MCV: 86.7 fL (ref 80.0–100.0)
Platelets: 184 10*3/uL (ref 150–400)
RBC: 4.07 MIL/uL (ref 3.87–5.11)
RDW: 13.3 % (ref 11.5–15.5)
WBC: 6.8 10*3/uL (ref 4.0–10.5)
nRBC: 0 % (ref 0.0–0.2)

## 2021-03-18 LAB — PROTEIN / CREATININE RATIO, URINE
Creatinine, Urine: 170 mg/dL
Protein Creatinine Ratio: 0.09 mg/mg{Cre} (ref 0.00–0.15)
Total Protein, Urine: 16 mg/dL

## 2021-03-18 LAB — RESP PANEL BY RT-PCR (FLU A&B, COVID) ARPGX2
Influenza A by PCR: NEGATIVE
Influenza B by PCR: NEGATIVE
SARS Coronavirus 2 by RT PCR: NEGATIVE

## 2021-03-18 MED ORDER — LIDOCAINE HCL (PF) 1 % IJ SOLN
30.0000 mL | INTRAMUSCULAR | Status: DC | PRN
  Filled 2021-03-18: qty 30

## 2021-03-18 MED ORDER — ACETAMINOPHEN 325 MG PO TABS: 650.0000 mg | ORAL_TABLET | ORAL | Status: DC | PRN

## 2021-03-18 MED ORDER — SENNOSIDES-DOCUSATE SODIUM 8.6-50 MG PO TABS
2.0000 | ORAL_TABLET | Freq: Every day | ORAL | Status: DC
  Administered 2021-03-19: 2 via ORAL
  Filled 2021-03-18: qty 2

## 2021-03-18 MED ORDER — SERTRALINE HCL 100 MG PO TABS
100.0000 mg | ORAL_TABLET | Freq: Every day | ORAL | Status: DC
  Filled 2021-03-18: qty 1

## 2021-03-18 MED ORDER — LACTATED RINGERS IV SOLN: 500.0000 mL | INTRAVENOUS | Status: DC | PRN

## 2021-03-18 MED ORDER — ONDANSETRON HCL 4 MG/2ML IJ SOLN
4.0000 mg | Freq: Four times a day (QID) | INTRAMUSCULAR | Status: DC | PRN
  Administered 2021-03-18: 4 mg via INTRAVENOUS
  Filled 2021-03-18: qty 2

## 2021-03-18 MED ORDER — SOD CITRATE-CITRIC ACID 500-334 MG/5ML PO SOLN: 30.0000 mL | ORAL | Status: DC | PRN

## 2021-03-18 MED ORDER — OXYTOCIN-SODIUM CHLORIDE 30-0.9 UT/500ML-% IV SOLN
2.5000 [IU]/h | INTRAVENOUS | Status: DC
  Filled 2021-03-18: qty 500

## 2021-03-18 MED ORDER — MISOPROSTOL 200 MCG PO TABS
ORAL_TABLET | ORAL | Status: AC
  Filled 2021-03-18: qty 4

## 2021-03-18 MED ORDER — OXYTOCIN 10 UNIT/ML IJ SOLN
INTRAMUSCULAR | Status: AC
  Filled 2021-03-18: qty 2

## 2021-03-18 MED ORDER — BENZOCAINE-MENTHOL 20-0.5 % EX AERO: 1.0000 "application " | INHALATION_SPRAY | CUTANEOUS | Status: DC | PRN

## 2021-03-18 MED ORDER — OXYTOCIN BOLUS FROM INFUSION
333.0000 mL | Freq: Once | INTRAVENOUS | Status: AC
  Administered 2021-03-18: 333 mL via INTRAVENOUS

## 2021-03-18 MED ORDER — OXYTOCIN-SODIUM CHLORIDE 30-0.9 UT/500ML-% IV SOLN: 1.0000 m[IU]/min | INTRAVENOUS | Status: DC

## 2021-03-18 MED ORDER — LACTATED RINGERS IV SOLN: INTRAVENOUS | Status: DC

## 2021-03-18 MED ORDER — FENTANYL CITRATE (PF) 100 MCG/2ML IJ SOLN
50.0000 ug | INTRAMUSCULAR | Status: DC | PRN
  Administered 2021-03-18: 100 ug via INTRAVENOUS
  Filled 2021-03-18: qty 2

## 2021-03-18 MED ORDER — TERBUTALINE SULFATE 1 MG/ML IJ SOLN: 0.2500 mg | Freq: Once | INTRAMUSCULAR | Status: DC | PRN

## 2021-03-18 MED ORDER — IBUPROFEN 600 MG PO TABS
600.0000 mg | ORAL_TABLET | Freq: Four times a day (QID) | ORAL | Status: DC
  Administered 2021-03-19 – 2021-03-20 (×5): 600 mg via ORAL
  Filled 2021-03-18 (×5): qty 1

## 2021-03-18 MED ORDER — AMMONIA AROMATIC IN INHA
RESPIRATORY_TRACT | Status: AC
  Filled 2021-03-18: qty 10

## 2021-03-18 NOTE — Progress Notes (Signed)
Labor Progress Note  Hannah Hogan is a 25 y.o. G3P2002 at [redacted]w[redacted]d by LMP admitted for active labor  Subjective: feeling pressure with UCs, breathing and coping well at this time.   Objective: BP 115/72 (BP Location: Right Arm)   Pulse 96   Temp 98 F (36.7 C) (Oral)   Resp 16   Ht 5\' 3"  (1.6 m)   Wt 106.1 kg   BMI 41.45 kg/m  Notable VS details: reviewed  Fetal Assessment: FHT:  FHR: 140 bpm, variability: moderate,  accelerations:  Present,  decelerations:  Present variables Category/reactivity:  Category II UC:   regular, every 2-3 minutes SVE:   8/90/-1, soft, anterior Membrane status:AROM at 1811 Amniotic color: Light meconium stained fluid noted.   Labs: Lab Results  Component Value Date   WBC 6.8 03/18/2021   HGB 11.9 (L) 03/18/2021   HCT 35.3 (L) 03/18/2021   MCV 86.7 03/18/2021   PLT 184 03/18/2021    Assessment / Plan: Augmentation of labor, progressing well  Labor: Progressing normally Preeclampsia:  no signs or symptoms of toxicity Fetal Wellbeing:  Category II, will continue to monitor closely  Pain Control:  Labor support without medications I/D:  n/a Anticipated MOD:  NSVD  05/19/2021, CNM 03/18/2021, 8:13 PM
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
## 2021-03-18 NOTE — OB Triage Note (Signed)
Pt Hannah Hogan 26 y.o. presents to labor and delivery triage reporting feeling overall uncomfortable  . Pt is a G3P2002 at [redacted]w[redacted]d . Pt denies signs and symptons consistent with rupture of membranes or active vaginal bleeding. Pt does report pink tinged mucus bleeding and irregular contractions, and states positive fetal movement. External FM and TOCO applied to non-tender abdomen and assessing. Initial FHR 140 . Vital signs obtained and within normal limits. Provider notified of pt and en-route to bedside to evaluate pt.

## 2021-03-19 LAB — CBC
HCT: 34.9 % — ABNORMAL LOW (ref 36.0–46.0)
Hemoglobin: 11.6 g/dL — ABNORMAL LOW (ref 12.0–15.0)
MCH: 28.8 pg (ref 26.0–34.0)
MCHC: 33.2 g/dL (ref 30.0–36.0)
MCV: 86.6 fL (ref 80.0–100.0)
Platelets: 197 10*3/uL (ref 150–400)
RBC: 4.03 MIL/uL (ref 3.87–5.11)
RDW: 13.2 % (ref 11.5–15.5)
WBC: 13 10*3/uL — ABNORMAL HIGH (ref 4.0–10.5)
nRBC: 0 % (ref 0.0–0.2)

## 2021-03-19 LAB — FETAL SCREEN: Fetal Screen: NEGATIVE

## 2021-03-19 LAB — RPR: RPR Ser Ql: NONREACTIVE

## 2021-03-19 MED ORDER — RHO D IMMUNE GLOBULIN 1500 UNIT/2ML IJ SOSY
300.0000 ug | PREFILLED_SYRINGE | Freq: Once | INTRAMUSCULAR | Status: AC
  Administered 2021-03-19: 300 ug via INTRAMUSCULAR
  Filled 2021-03-19: qty 2

## 2021-03-19 MED ORDER — DIBUCAINE (PERIANAL) 1 % EX OINT: 1.0000 "application " | TOPICAL_OINTMENT | CUTANEOUS | Status: DC | PRN

## 2021-03-19 MED ORDER — DIPHENHYDRAMINE HCL 25 MG PO CAPS: 25.0000 mg | ORAL_CAPSULE | Freq: Four times a day (QID) | ORAL | Status: DC | PRN

## 2021-03-19 MED ORDER — PRENATAL MULTIVITAMIN CH
1.0000 | ORAL_TABLET | Freq: Every day | ORAL | Status: DC
  Administered 2021-03-19: 1 via ORAL
  Filled 2021-03-19: qty 1

## 2021-03-19 MED ORDER — ZOLPIDEM TARTRATE 5 MG PO TABS: 5.0000 mg | ORAL_TABLET | Freq: Every evening | ORAL | Status: DC | PRN

## 2021-03-19 MED ORDER — COCONUT OIL OIL
1.0000 "application " | TOPICAL_OIL | Status: DC | PRN
  Administered 2021-03-19: 1 via TOPICAL
  Filled 2021-03-19: qty 120

## 2021-03-19 MED ORDER — ONDANSETRON HCL 4 MG/2ML IJ SOLN: 4.0000 mg | INTRAMUSCULAR | Status: DC | PRN

## 2021-03-19 MED ORDER — SIMETHICONE 80 MG PO CHEW: 80.0000 mg | CHEWABLE_TABLET | ORAL | Status: DC | PRN

## 2021-03-19 MED ORDER — ONDANSETRON HCL 4 MG PO TABS: 4.0000 mg | ORAL_TABLET | ORAL | Status: DC | PRN

## 2021-03-19 MED ORDER — WITCH HAZEL-GLYCERIN EX PADS: 1.0000 "application " | MEDICATED_PAD | CUTANEOUS | Status: DC | PRN

## 2021-03-19 NOTE — Progress Notes (Signed)
Postpartum Day  1  Subjective: no complaints, up ad lib, voiding, and tolerating PO  Doing well, no concerns. Ambulating without difficulty, pain managed with PO meds, tolerating regular diet, and voiding without difficulty.   No fever/chills, chest pain, shortness of breath, nausea/vomiting, or leg pain. No nipple or breast pain. No headache, visual changes, or RUQ/epigastric pain.  Objective: BP 109/67 (BP Location: Left Arm)   Pulse 67   Temp 98.5 F (36.9 C) (Oral)   Resp 18   Ht 5\' 3"  (1.6 m)   Wt 106.1 kg   SpO2 100% Comment: Room Air  Breastfeeding Unknown   BMI 41.45 kg/m    Physical Exam:  General: alert, cooperative, and no distress Breasts: soft/nontender CV: RRR Pulm: nl effort, CTABL Abdomen: soft, non-tender, active bowel sounds Uterine Fundus: firm Perineum: minimal edema, intact Lochia: appropriate DVT Evaluation: No evidence of DVT seen on physical exam.  Recent Labs    03/18/21 1815 03/19/21 0437  HGB 11.9* 11.6*  HCT 35.3* 34.9*  WBC 6.8 13.0*  PLT 184 197    Assessment/Plan: 25 y.o. G3P3003 postpartum day # 1  -Continue routine postpartum care -Lactation consult PRN for breastfeeding -Discussed contraceptive options including implant, IUDs hormonal and non-hormonal, injection, pills/ring/patch, condoms, and NFP. Desires Interval BTL. Consents signed during prenatal visits.  Will schedule outpatient.  -CBC reviewed- hemodynamically stable  -Immunization status:   all immunizations up to date   Disposition: Continue inpatient postpartum care   LOS: 1 day   30, CNM 03/19/2021, 8:50 AM   ----- 05/20/2021  Certified Nurse Midwife [facility]

## 2021-03-19 NOTE — Discharge Instructions (Addendum)
Discharge Instructions:   Follow-up Appointment:  If there are any new medications, they have been ordered and will be available for pickup at the listed pharmacy on your way home from the hospital.   Call office if you have any of the following: headache, visual changes, fever >101.0 F, chills, shortness of breath, breast concerns, excessive vaginal bleeding, incision drainage or problems, leg pain or redness, depression or any other concerns. If you have vaginal discharge with an odor, let your doctor know.   It is normal to bleed for up to 6 weeks. You should not soak through more than 1 pad in 1 hour. If you have a blood clot larger than your fist with continued bleeding, call your doctor.   Activity: Do not lift > 10 lbs for 6 weeks (do not lift anything heavier than your baby). No intercourse, tampons, swimming pools, hot tubs, baths (only showers) for 6 weeks.  No driving for 1-2 weeks. Continue prenatal vitamin, especially if breastfeeding. Increase calories and fluids (water) while breastfeeding.   Your milk will come in, in the next couple of days (right now it is colostrum). You may have a slight fever when your milk comes in, but it should go away on its own.  If it does not, and rises above 101 F please call the doctor. You will also feel achy and your breasts will be firm. They will also start to leak. If you are breastfeeding, continue as you have been and you can pump/express milk for comfort.   If you have too much milk, your breasts can become engorged, which could lead to mastitis. This is an infection of the milk ducts. It can be very painful and you will need to notify your doctor to obtain a prescription for antibiotics. You can also treat it with a shower or hot/cold compress.   For concerns about your baby, please call your pediatrician.  For breastfeeding concerns, the lactation consultant can be reached at [phone].   Postpartum blues (feelings of happy one minute  and sad another minute) are normal for the first few weeks but if it gets worse let your doctor know.   Congratulations! We enjoyed caring for you and your new bundle of joy!     Vaginal Delivery, Care After Refer to this sheet in the next few weeks. These discharge instructions provide you with information on caring for yourself after delivery. Your caregiver may also give you specific instructions. Your treatment has been planned according to the most current medical practices available, but problems sometimes occur. Call your caregiver if you have any problems or questions after you go home. HOME CARE INSTRUCTIONS Take over-the-counter or prescription medicines only as directed by your caregiver or pharmacist. Do not drink alcohol, especially if you are breastfeeding or taking medicine to relieve pain. Do not smoke tobacco. Continue to use good perineal care. Good perineal care includes: Wiping your perineum from back to front Keeping your perineum clean. You can do sitz baths twice a day, to help keep this area clean Do not use tampons, douche or have sex until your caregiver says it is okay. Shower only and avoid sitting in submerged water, aside from sitz baths Wear a well-fitting bra that provides breast support. Eat healthy foods. Drink enough fluids to keep your urine clear or pale yellow. Eat high-fiber foods such as whole grain cereals and breads, brown rice, beans, and fresh fruits and vegetables every day. These foods may help prevent or relieve constipation.   Avoid constipation with high fiber foods or medications, such as miralax or metamucil Follow your caregiver's recommendations regarding resumption of activities such as climbing stairs, driving, lifting, exercising, or traveling. Talk to your caregiver about resuming sexual activities. Resumption of sexual activities is dependent upon your risk of infection, your rate of healing, and your comfort and desire to resume sexual  activity. Try to have someone help you with your household activities and your newborn for at least a few days after you leave the hospital. Rest as much as possible. Try to rest or take a nap when your newborn is sleeping. Increase your activities gradually. Keep all of your scheduled postpartum appointments. It is very important to keep your scheduled follow-up appointments. At these appointments, your caregiver will be checking to make sure that you are healing physically and emotionally. SEEK MEDICAL CARE IF:  You are passing large clots from your vagina. Save any clots to show your caregiver. You have a foul smelling discharge from your vagina. You have trouble urinating. You are urinating frequently. You have pain when you urinate. You have a change in your bowel movements. You have increasing redness, pain, or swelling near your vaginal incision (episiotomy) or vaginal tear. You have pus draining from your episiotomy or vaginal tear. Your episiotomy or vaginal tear is separating. You have painful, hard, or reddened breasts. You have a severe headache. You have blurred vision or see spots. You feel sad or depressed. You have thoughts of hurting yourself or your newborn. You have questions about your care, the care of your newborn, or medicines. You are dizzy or light-headed. You have a rash. You have nausea or vomiting. You were breastfeeding and have not had a menstrual period within 12 weeks after you stopped breastfeeding. You are not breastfeeding and have not had a menstrual period by the 12th week after delivery. You have a fever. SEEK IMMEDIATE MEDICAL CARE IF:  You have persistent pain. You have chest pain. You have shortness of breath. You faint. You have leg pain. You have stomach pain. Your vaginal bleeding saturates two or more sanitary pads in 1 hour. MAKE SURE YOU:  Understand these instructions. Will watch your condition. Will get help right away if you are  not doing well or get worse. Document Released: 08/26/2000 Document Revised: 01/13/2014 Document Reviewed: 04/25/2012 ExitCare Patient Information 2015 [employer]. This information is not intended to replace advice given to you by your health care provider. Make sure you discuss any questions you have with your health care provider.  Sitz Bath A sitz bath is a warm water bath taken in the sitting position. The water covers only the hips and butt (buttocks). We recommend using one that fits in the toilet, to help with ease of use and cleanliness. It may be used for either healing or cleaning purposes. Sitz baths are also used to relieve pain, itching, or muscle tightening (spasms). The water may contain medicine. Moist heat will help you heal and relax.  HOME CARE  Take 3 to 4 sitz baths a day. Fill the bathtub half-full with warm water. Sit in the water and open the drain a little. Turn on the warm water to keep the tub half-full. Keep the water running constantly. Soak in the water for 15 to 20 minutes. After the sitz bath, pat the affected area dry. GET HELP RIGHT AWAY IF: You get worse instead of better. Stop the sitz baths if you get worse. MAKE SURE YOU: Understand these instructions.   Will watch your condition. Will get help right away if you are not doing well or get worse. Document Released: 10/06/2004 Document Revised: 05/23/2012 Document Reviewed: 12/27/2010 ExitCare Patient Information 2015 [employer]. This information is not intended to replace advice given to you by your health care provider. Make sure you discuss any questions you have with your health care provider.   

## 2021-03-19 NOTE — Lactation Note (Addendum)
This note was copied from a baby's chart. Lactation Consultation Note  Patient Name: Boy Quinnetta Roepke JJHER'D Date: 03/19/2021 Reason for consult: Initial assessment;Term Age:24 hours  Maternal Data Has patient been taught Hand Expression?: Yes Does the patient have breastfeeding experience prior to this delivery?: Yes How long did the patient breastfeed?: 10 mths  Feeding Mother's Current Feeding Choice: Breast Milk Assisted mom with latching baby to right breast in cradle hold, latches easily, baby sleepy and needing some stimulation but swallows heard  LATCH Score Latch: Grasps breast easily, tongue down, lips flanged, rhythmical sucking.  Audible Swallowing: Spontaneous and intermittent  Type of Nipple: Everted at rest and after stimulation  Comfort (Breast/Nipple): Soft / non-tender  Hold (Positioning): Assistance needed to correctly position infant at breast and maintain latch.  LATCH Score: 9   Lactation Tools Discussed/Used  LC name and no written on white board  Interventions Interventions: Assisted with latch;Breast feeding basics reviewed;Skin to skin;Hand express;Adjust position;Support pillows;Education  Discharge Pump: Personal WIC Program: Yes  Consult Status Consult Status: PRN    Dyann Kief 03/19/2021, 11:26 AM

## 2021-03-20 LAB — RHOGAM INJECTION: Unit division: 0

## 2021-03-20 MED ORDER — ACETAMINOPHEN 325 MG PO TABS: 650.0000 mg | ORAL_TABLET | ORAL | Status: AC | PRN

## 2021-03-20 MED ORDER — IBUPROFEN 600 MG PO TABS: 600.0000 mg | ORAL_TABLET | Freq: Four times a day (QID) | ORAL | 0 refills | Status: AC | PRN

## 2021-03-20 NOTE — Progress Notes (Signed)
Mother discharged.  Discharge instructions given.  Mother verbalizes understanding.  Transported by auxiliary.  

## 2021-03-22 ENCOUNTER — Telehealth: Payer: Self-pay

## 2021-03-22 NOTE — Telephone Encounter (Signed)
Transition Care Management Unsuccessful Follow-up Telephone Call  Date of discharge and from where:  7/9/2022[facility]  Attempts:  1st Attempt  Reason for unsuccessful TCM follow-up call:  Left voice message

## 2021-03-23 NOTE — Telephone Encounter (Signed)
Transition Care Management Follow-up Telephone Call Date of discharge and from where: 7/9/2022[facility] How have you been since you were released from the hospital? Patient states she's doing good. Any questions or concerns? No  Items Reviewed: Did the pt receive and understand the discharge instructions provided? Yes  Medications obtained and verified? Yes  Other? No  Any new allergies since your discharge? No  Dietary orders reviewed? N/A Do you have support at home? Yes   Home Care and Equipment/Supplies: Were home health services ordered? not applicable If so, what is the name of the agency? N/A  Has the agency set up a time to come to the patient's home? not applicable Were any new equipment or medical supplies ordered?  No What is the name of the medical supply agency? N/A Were you able to get the supplies/equipment? not applicable Do you have any questions related to the use of the equipment or supplies? No  Functional Questionnaire: (I = Independent and D = Dependent) ADLs: I  Bathing/Dressing- I  Meal Prep- I  Eating- I  Maintaining continence- I  Transferring/Ambulation- I  Managing Meds- I  Follow up appointments reviewed:  PCP Hospital f/u appt confirmed? No  Patient advised to continue routine follow up with PCP Specialist Hospital f/u appt confirmed? No  patient will call to schedule follow up with OBGYN Are transportation arrangements needed? No  If their condition worsens, is the pt aware to call PCP or go to the Emergency Dept.? Yes Was the patient provided with contact information for the PCP's office or ED? Yes Was to pt encouraged to call back with questions or concerns? Yes

## 2021-04-07 ENCOUNTER — Other Ambulatory Visit: Payer: Self-pay | Admitting: Obstetrics and Gynecology
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
## 2021-05-27 ENCOUNTER — Other Ambulatory Visit
Admission: RE | Admit: 2021-05-27 | Discharge: 2021-05-27 | Disposition: A | Discharge status: Home / Self Care | Payer: MEDICAID | Source: Ambulatory Visit | Attending: Obstetrics and Gynecology | Admitting: Obstetrics and Gynecology

## 2021-05-27 ENCOUNTER — Other Ambulatory Visit: Payer: Self-pay

## 2021-05-27 HISTORY — DX: Headache, unspecified: R51.9

## 2021-05-27 HISTORY — DX: Anxiety disorder, unspecified: F41.9

## 2021-05-27 NOTE — Patient Instructions (Addendum)
Your procedure is scheduled on: 06/07/21 - Monday Report to the Registration Desk on the 1st floor of the [facility]. To find out your arrival time, please call [phone] between 1PM - 3PM on: 06/04/21 - Friday  REMEMBER: Instructions that are not followed completely may result in serious medical risk, up to and including death; or upon the discretion of your surgeon and anesthesiologist your surgery may need to be rescheduled.  Do not eat food after midnight the night before surgery.  No gum chewing, lozengers or hard candies.  You may however, drink CLEAR liquids up to 2 hours before you are scheduled to arrive for your surgery. Do not drink anything within 2 hours of your scheduled arrival time.  Clear liquids include: - water  - apple juice without pulp - gatorade (not RED, PURPLE, OR BLUE) - black coffee or tea (Do NOT add milk or creamers to the coffee or tea) Do NOT drink anything that is not on this list.  TAKE THESE MEDICATIONS THE MORNING OF SURGERY WITH A SIP OF WATER:    One week prior to surgery: Stop Anti-inflammatories (NSAIDS) such as Advil, Aleve, Ibuprofen, Motrin, Naproxen, Naprosyn and Aspirin based products such as Excedrin, Goodys Powder, BC Powder.  Stop ANY OVER THE COUNTER supplements until after surgery.  You may however, continue to take Tylenol as directed if needed for pain up until the day of surgery.  No Alcohol for 24 hours before or after surgery.  No Smoking including e-cigarettes for 24 hours prior to surgery.  No chewable tobacco products for at least 6 hours prior to surgery.  No nicotine patches on the day of surgery.  Do not use any "recreational" drugs for at least a week prior to your surgery.  Please be advised that the combination of cocaine and anesthesia may have negative outcomes, up to and including death. If you test positive for cocaine, your surgery will be cancelled.  On the morning of surgery brush your teeth with  toothpaste and water, you may rinse your mouth with mouthwash if you wish. Do not swallow any toothpaste or mouthwash.  Do not wear jewelry, make-up, hairpins, clips or nail polish.  Do not wear lotions, powders, or perfumes.   Do not shave body from the neck down 48 hours prior to surgery just in case you cut yourself which could leave a site for infection.  Also, freshly shaved skin may become irritated if using the CHG soap.  Contact lenses, hearing aids and dentures may not be worn into surgery.  Do not bring valuables to the hospital. [facility] is not responsible for any missing/lost belongings or valuables.   Notify your doctor if there is any change in your medical condition (cold, fever, infection).  Wear comfortable clothing (specific to your surgery type) to the hospital.  After surgery, you can help prevent lung complications by doing breathing exercises.  Take deep breaths and cough every 1-2 hours. Your doctor may order a device called an Incentive Spirometer to help you take deep breaths. When coughing or sneezing, hold a pillow firmly against your incision with both hands. This is called "splinting." Doing this helps protect your incision. It also decreases belly discomfort.  If you are being admitted to the hospital overnight, leave your suitcase in the car. After surgery it may be brought to your room.  If you are being discharged the day of surgery, you will not be allowed to drive home. You will need a responsible  adult (18 years or older) to drive you home and stay with you that night.   If you are taking public transportation, you will need to have a responsible adult (18 years or older) with you. Please confirm with your physician that it is acceptable to use public transportation.   Please call the Pre-admissions Testing Dept. at [phone] if you have any questions about these instructions.  Surgery Visitation Policy:  Patients undergoing a surgery or  procedure may have one family member or support person with them as long as that person is not COVID-19 positive or experiencing its symptoms.  That person may remain in the waiting area during the procedure.  Inpatient Visitation:    Visiting hours are 7 a.m. to 8 p.m. Inpatients will be allowed two visitors daily. The visitors may change each day during the patient's stay. No visitors under the age of 63. Any visitor under the age of 40 must be accompanied by an adult. The visitor must pass COVID-19 screenings, use hand sanitizer when entering and exiting the patient's room and wear a mask at all times, including in the patient's room. Patients must also wear a mask when staff or their visitor are in the room. Masking is required regardless of vaccination status.

## 2021-06-07 ENCOUNTER — Ambulatory Visit: Payer: Medicaid Other | Admitting: Certified Registered Nurse Anesthetist

## 2021-06-07 ENCOUNTER — Encounter
Admission: RE | Disposition: A | Discharge status: Home / Self Care | Payer: Self-pay | Source: Home / Self Care | Attending: Obstetrics and Gynecology

## 2021-06-07 ENCOUNTER — Ambulatory Visit
Admission: RE | Admit: 2021-06-07 | Discharge: 2021-06-07 | Disposition: A | Discharge status: Home / Self Care | Payer: Medicaid Other | Attending: Obstetrics and Gynecology | Admitting: Obstetrics and Gynecology

## 2021-06-07 DIAGNOSIS — Z6836 Body mass index (BMI) 36.0-36.9, adult: Secondary | ICD-10-CM | POA: Diagnosis not present

## 2021-06-07 DIAGNOSIS — E669 Obesity, unspecified: Secondary | ICD-10-CM | POA: Diagnosis not present

## 2021-06-07 DIAGNOSIS — Z302 Encounter for sterilization: Secondary | ICD-10-CM | POA: Diagnosis present

## 2021-06-07 HISTORY — PX: LAPAROSCOPIC TUBAL LIGATION: SHX1937

## 2021-06-07 LAB — CBC
HCT: 36 % (ref 36.0–46.0)
Hemoglobin: 12.6 g/dL (ref 12.0–15.0)
MCH: 29.9 pg (ref 26.0–34.0)
MCHC: 35 g/dL (ref 30.0–36.0)
MCV: 85.5 fL (ref 80.0–100.0)
Platelets: 257 10*3/uL (ref 150–400)
RBC: 4.21 MIL/uL (ref 3.87–5.11)
RDW: 13.7 % (ref 11.5–15.5)
WBC: 7 10*3/uL (ref 4.0–10.5)
nRBC: 0 % (ref 0.0–0.2)

## 2021-06-07 LAB — BASIC METABOLIC PANEL
Anion gap: 8 (ref 5–15)
BUN: 17 mg/dL (ref 6–20)
CO2: 22 mmol/L (ref 22–32)
Calcium: 8.7 mg/dL — ABNORMAL LOW (ref 8.9–10.3)
Chloride: 109 mmol/L (ref 98–111)
Creatinine, Ser: 0.63 mg/dL (ref 0.44–1.00)
GFR, Estimated: >60 mL/min (ref >60)
Glucose, Bld: 96 mg/dL (ref 70–99)
Potassium: 3.6 mmol/L (ref 3.5–5.1)
Sodium: 139 mmol/L (ref 135–145)

## 2021-06-07 LAB — POCT PREGNANCY, URINE: Preg Test, Ur: NEGATIVE

## 2021-06-07 SURGERY — LIGATION, FALLOPIAN TUBE, LAPAROSCOPIC
Anesthesia: General | Laterality: Bilateral

## 2021-06-07 MED ORDER — DEXAMETHASONE SODIUM PHOSPHATE 10 MG/ML IJ SOLN
INTRAMUSCULAR | Status: DC | PRN
  Administered 2021-06-07: 10 mg via INTRAVENOUS

## 2021-06-07 MED ORDER — MIDAZOLAM HCL 2 MG/2ML IJ SOLN
INTRAMUSCULAR | Status: DC | PRN
  Administered 2021-06-07: 2 mg via INTRAVENOUS

## 2021-06-07 MED ORDER — PROMETHAZINE HCL 25 MG/ML IJ SOLN: 6.2500 mg | INTRAMUSCULAR | Status: DC | PRN

## 2021-06-07 MED ORDER — SODIUM CHLORIDE 0.9 % IR SOLN
Status: DC | PRN
  Administered 2021-06-07: 1000 mL

## 2021-06-07 MED ORDER — IBUPROFEN 800 MG PO TABS: 800.0000 mg | ORAL_TABLET | Freq: Three times a day (TID) | ORAL | 1 refills | Status: AC

## 2021-06-07 MED ORDER — ONDANSETRON HCL 4 MG/2ML IJ SOLN
INTRAMUSCULAR | Status: DC | PRN
  Administered 2021-06-07: 4 mg via INTRAVENOUS

## 2021-06-07 MED ORDER — MEPERIDINE HCL 25 MG/ML IJ SOLN: 6.2500 mg | INTRAMUSCULAR | Status: DC | PRN

## 2021-06-07 MED ORDER — OXYCODONE HCL 5 MG PO TABS: 5.0000 mg | ORAL_TABLET | Freq: Once | ORAL | Status: DC | PRN

## 2021-06-07 MED ORDER — KETOROLAC TROMETHAMINE 30 MG/ML IJ SOLN
INTRAMUSCULAR | Status: AC
  Filled 2021-06-07: qty 1

## 2021-06-07 MED ORDER — LIDOCAINE HCL (CARDIAC) PF 100 MG/5ML IV SOSY
PREFILLED_SYRINGE | INTRAVENOUS | Status: DC | PRN
  Administered 2021-06-07: 60 mg via INTRAVENOUS

## 2021-06-07 MED ORDER — CEFAZOLIN SODIUM-DEXTROSE 2-3 GM-%(50ML) IV SOLR
INTRAVENOUS | Status: DC | PRN
  Administered 2021-06-07: 2 g via INTRAVENOUS

## 2021-06-07 MED ORDER — MIDAZOLAM HCL 2 MG/2ML IJ SOLN
INTRAMUSCULAR | Status: AC
  Filled 2021-06-07: qty 2

## 2021-06-07 MED ORDER — ROCURONIUM BROMIDE 100 MG/10ML IV SOLN
INTRAVENOUS | Status: DC | PRN
  Administered 2021-06-07: 40 mg via INTRAVENOUS

## 2021-06-07 MED ORDER — FAMOTIDINE 20 MG PO TABS
ORAL_TABLET | ORAL | Status: AC
  Filled 2021-06-07: qty 1

## 2021-06-07 MED ORDER — DOCUSATE SODIUM 100 MG PO CAPS: 100.0000 mg | ORAL_CAPSULE | Freq: Two times a day (BID) | ORAL | 0 refills | Status: AC

## 2021-06-07 MED ORDER — PROPOFOL 10 MG/ML IV BOLUS
INTRAVENOUS | Status: DC | PRN
  Administered 2021-06-07: 150 mg via INTRAVENOUS

## 2021-06-07 MED ORDER — DEXMEDETOMIDINE (PRECEDEX) IN NS 20 MCG/5ML (4 MCG/ML) IV SYRINGE
PREFILLED_SYRINGE | INTRAVENOUS | Status: AC
  Filled 2021-06-07: qty 5

## 2021-06-07 MED ORDER — EPHEDRINE SULFATE 50 MG/ML IJ SOLN
INTRAMUSCULAR | Status: DC | PRN
  Administered 2021-06-07: 5 mg via INTRAVENOUS

## 2021-06-07 MED ORDER — ACETAMINOPHEN 500 MG PO TABS: 1000.0000 mg | ORAL_TABLET | Freq: Four times a day (QID) | ORAL | 0 refills | Status: AC

## 2021-06-07 MED ORDER — FAMOTIDINE 20 MG PO TABS
20.0000 mg | ORAL_TABLET | Freq: Once | ORAL | Status: AC
  Administered 2021-06-07: 20 mg via ORAL

## 2021-06-07 MED ORDER — ONDANSETRON HCL 4 MG/2ML IJ SOLN
INTRAMUSCULAR | Status: AC
  Filled 2021-06-07: qty 2

## 2021-06-07 MED ORDER — PHENYLEPHRINE HCL (PRESSORS) 10 MG/ML IV SOLN
INTRAVENOUS | Status: AC
  Filled 2021-06-07: qty 1

## 2021-06-07 MED ORDER — BUPIVACAINE HCL 0.5 % IJ SOLN
INTRAMUSCULAR | Status: DC | PRN
  Administered 2021-06-07: 10 mL

## 2021-06-07 MED ORDER — OXYCODONE HCL 5 MG PO TABS: 5.0000 mg | ORAL_TABLET | ORAL | 0 refills | Status: AC | PRN

## 2021-06-07 MED ORDER — LACTATED RINGERS IV SOLN: INTRAVENOUS | Status: DC

## 2021-06-07 MED ORDER — LIDOCAINE HCL (PF) 2 % IJ SOLN
INTRAMUSCULAR | Status: AC
  Filled 2021-06-07: qty 5

## 2021-06-07 MED ORDER — FENTANYL CITRATE (PF) 100 MCG/2ML IJ SOLN
INTRAMUSCULAR | Status: DC | PRN
  Administered 2021-06-07 (×2): 50 ug via INTRAVENOUS

## 2021-06-07 MED ORDER — CEFAZOLIN SODIUM-DEXTROSE 2-4 GM/100ML-% IV SOLN
INTRAVENOUS | Status: AC
  Filled 2021-06-07: qty 100

## 2021-06-07 MED ORDER — CHLORHEXIDINE GLUCONATE 0.12 % MT SOLN
OROMUCOSAL | Status: AC
  Filled 2021-06-07: qty 15

## 2021-06-07 MED ORDER — FENTANYL CITRATE (PF) 100 MCG/2ML IJ SOLN
INTRAMUSCULAR | Status: AC
  Filled 2021-06-07: qty 2

## 2021-06-07 MED ORDER — SUGAMMADEX SODIUM 200 MG/2ML IV SOLN
INTRAVENOUS | Status: DC | PRN
  Administered 2021-06-07: 200 mg via INTRAVENOUS

## 2021-06-07 MED ORDER — CHLORHEXIDINE GLUCONATE 0.12 % MT SOLN
15.0000 mL | Freq: Once | OROMUCOSAL | Status: AC
  Administered 2021-06-07: 15 mL via OROMUCOSAL

## 2021-06-07 MED ORDER — PHENYLEPHRINE HCL (PRESSORS) 10 MG/ML IV SOLN
INTRAVENOUS | Status: DC | PRN
  Administered 2021-06-07 (×2): 50 ug via INTRAVENOUS

## 2021-06-07 MED ORDER — ORAL CARE MOUTH RINSE: 15.0000 mL | Freq: Once | OROMUCOSAL | Status: AC

## 2021-06-07 MED ORDER — BUPIVACAINE HCL (PF) 0.5 % IJ SOLN
INTRAMUSCULAR | Status: AC
  Filled 2021-06-07: qty 30

## 2021-06-07 MED ORDER — OXYCODONE HCL 5 MG/5ML PO SOLN
5.0000 mg | Freq: Once | ORAL | Status: DC | PRN
Start: 2021-06-07 — End: 2021-06-07

## 2021-06-07 MED ORDER — PROPOFOL 10 MG/ML IV BOLUS
INTRAVENOUS | Status: AC
  Filled 2021-06-07: qty 20

## 2021-06-07 MED ORDER — FENTANYL CITRATE (PF) 100 MCG/2ML IJ SOLN: 25.0000 ug | INTRAMUSCULAR | Status: DC | PRN

## 2021-06-07 SURGICAL SUPPLY — 44 items
ADH SKN CLS APL DERMABOND .7 (GAUZE/BANDAGES/DRESSINGS) ×1
APL PRP STRL LF DISP 70% ISPRP (MISCELLANEOUS)
BLADE SURG SZ11 CARB STEEL (BLADE) ×2 IMPLANT
CATH ROBINSON RED A/P 16FR (CATHETERS) ×2 IMPLANT
CHLORAPREP W/TINT 26 (MISCELLANEOUS) IMPLANT
DERMABOND ADVANCED (GAUZE/BANDAGES/DRESSINGS) ×1
DERMABOND ADVANCED .7 DNX12 (GAUZE/BANDAGES/DRESSINGS) ×1 IMPLANT
DRAPE GENERAL ENDO 106X123.5 (DRAPES) ×2 IMPLANT
DRAPE LEGGINS SURG 28X43 STRL (DRAPES) ×2 IMPLANT
DRAPE UNDER BUTTOCK W/FLU (DRAPES) ×2 IMPLANT
DRAPE UTILITY 15X26 TOWEL STRL (DRAPES) ×4 IMPLANT
GAUZE 4X4 16PLY ~~LOC~~+RFID DBL (SPONGE) ×4 IMPLANT
GLOVE SURG ENC MOIS LTX SZ7 (GLOVE) ×4 IMPLANT
GLOVE SURG UNDER LTX SZ7.5 (GLOVE) ×2 IMPLANT
GOWN STRL REUS W/ TWL LRG LVL3 (GOWN DISPOSABLE) ×2 IMPLANT
GOWN STRL REUS W/TWL LRG LVL3 (GOWN DISPOSABLE) ×4
GRASPER SUT TROCAR 14GX15 (MISCELLANEOUS) ×2 IMPLANT
IRRIGATION STRYKERFLOW (MISCELLANEOUS) ×1 IMPLANT
IRRIGATOR STRYKERFLOW (MISCELLANEOUS) ×2
IV NS 1000ML (IV SOLUTION) ×2
IV NS 1000ML BAXH (IV SOLUTION) ×1 IMPLANT
IV NS IRRIG 3000ML ARTHROMATIC (IV SOLUTION) ×2 IMPLANT
KIT PINK PAD W/HEAD ARE REST (MISCELLANEOUS) ×2
KIT PINK PAD W/HEAD ARM REST (MISCELLANEOUS) ×1 IMPLANT
KIT TURNOVER CYSTO (KITS) ×2 IMPLANT
LABEL OR SOLS (LABEL) ×2 IMPLANT
LIGASURE VESSEL 5MM BLUNT TIP (ELECTROSURGICAL) ×2 IMPLANT
MANIFOLD NEPTUNE II (INSTRUMENTS) ×2 IMPLANT
NS IRRIG 500ML POUR BTL (IV SOLUTION) ×2 IMPLANT
PACK GYN LAPAROSCOPIC (MISCELLANEOUS) ×2 IMPLANT
PAD OB MATERNITY 4.3X12.25 (PERSONAL CARE ITEMS) IMPLANT
PAD PREP 24X41 OB/GYN DISP (PERSONAL CARE ITEMS) ×2 IMPLANT
SCRUB EXIDINE 4% CHG 4OZ (MISCELLANEOUS) ×2 IMPLANT
SET CYSTO W/LG BORE CLAMP LF (SET/KITS/TRAYS/PACK) ×2 IMPLANT
SET TUBE SMOKE EVAC HIGH FLOW (TUBING) ×2 IMPLANT
SLEEVE ENDOPATH XCEL 5M (ENDOMECHANICALS) ×4 IMPLANT
STRIP CLOSURE SKIN 1/4X4 (GAUZE/BANDAGES/DRESSINGS) ×2 IMPLANT
SUT MNCRL 4-0 (SUTURE) ×2
SUT MNCRL 4-0 27XMFL (SUTURE) ×1
SUT VIC AB 0 UR5 27 (SUTURE) ×2 IMPLANT
SUT VIC AB 2-0 UR6 27 (SUTURE) ×2 IMPLANT
SUTURE MNCRL 4-0 27XMF (SUTURE) ×1 IMPLANT
TROCAR XCEL NON-BLD 5MMX100MML (ENDOMECHANICALS) ×2 IMPLANT
WATER STERILE IRR 500ML POUR (IV SOLUTION) ×2 IMPLANT

## 2021-06-07 NOTE — Interval H&P Note (Signed)
History and Physical Interval Note:  06/07/2021 7:33 AM  Hannah Hogan  has presented today for surgery, with the diagnosis of undesired fertility.  The various methods of treatment have been discussed with the patient and family. After consideration of risks, benefits and other options for treatment, the patient has consented to  Procedure(s): LAPAROSCOPIC TUBAL LIGATION (Bilateral) as a surgical intervention.  The patient's history has been reviewed, patient examined, no change in status, stable for surgery.  I have reviewed the patient's chart and labs.  Questions were answered to the patient's satisfaction.     Christeen Douglas

## 2021-06-07 NOTE — Anesthesia Preprocedure Evaluation (Addendum)
Anesthesia Evaluation  Patient identified by MRN, date of birth, ID band Patient awake    Reviewed: Allergy & Precautions, NPO status , Patient's Chart, lab work & pertinent test results  History of Anesthesia Complications Negative for: history of anesthetic complications  Airway Mallampati: II  TM Distance: >3 FB Neck ROM: Full    Dental no notable dental hx.    Pulmonary neg pulmonary ROS, neg sleep apnea, neg COPD,    breath sounds clear to auscultation- rhonchi (-) wheezing      Cardiovascular Exercise Tolerance: Good (-) hypertension(-) CAD and (-) Past MI  Rhythm:Regular Rate:Normal - Systolic murmurs and - Diastolic murmurs    Neuro/Psych  Headaches, PSYCHIATRIC DISORDERS Anxiety    GI/Hepatic negative GI ROS, Neg liver ROS,   Endo/Other  negative endocrine ROSneg diabetes  Renal/GU negative Renal ROS     Musculoskeletal negative musculoskeletal ROS (+)   Abdominal (+) - obese,   Peds  Hematology negative hematology ROS (+)   Anesthesia Other Findings Past Medical History: No date: Anxiety No date: Headache    Reproductive/Obstetrics                             Anesthesia Physical Anesthesia Plan  ASA: 2  Anesthesia Plan: General   Post-op Pain Management:    Induction: Intravenous  PONV Risk Score and Plan: 2 and Ondansetron, Dexamethasone and Midazolam  Airway Management Planned: Oral ETT  Additional Equipment:   Intra-op Plan:   Post-operative Plan: Extubation in OR  Informed Consent: I have reviewed the patients History and Physical, chart, labs and discussed the procedure including the risks, benefits and alternatives for the proposed anesthesia with the patient or authorized representative who has indicated his/her understanding and acceptance.     Dental advisory given  Plan Discussed with: CRNA and Anesthesiologist  Anesthesia Plan Comments:          Anesthesia Quick Evaluation

## 2021-06-07 NOTE — Anesthesia Procedure Notes (Signed)
Procedure Name: Intubation Date/Time: 06/07/2021 7:44 AM Performed by: Hezzie Bump, CRNA Pre-anesthesia Checklist: Patient identified, Patient being monitored, Timeout performed, Emergency Drugs available and Suction available Patient Re-evaluated:Patient Re-evaluated prior to induction Oxygen Delivery Method: Circle system utilized Preoxygenation: Pre-oxygenation with 100% oxygen Induction Type: IV induction Ventilation: Mask ventilation without difficulty Laryngoscope Size: 3 and McGraph Grade View: Grade I Tube type: Oral Tube size: 7.0 mm Number of attempts: 1 Airway Equipment and Method: Stylet Placement Confirmation: ETT inserted through vocal cords under direct vision, positive ETCO2 and breath sounds checked- equal and bilateral Secured at: 20 cm Tube secured with: Tape Dental Injury: Teeth and Oropharynx as per pre-operative assessment

## 2021-06-07 NOTE — Anesthesia Postprocedure Evaluation (Signed)
Anesthesia Post Note  Patient: Hannah Hogan  Procedure(s) Performed: LAPAROSCOPIC TUBAL LIGATION, CYSTOSCOPY (Bilateral)  Patient location during evaluation: PACU Anesthesia Type: General Level of consciousness: awake and alert and oriented Pain management: pain level controlled Vital Signs Assessment: post-procedure vital signs reviewed and stable Respiratory status: spontaneous breathing, nonlabored ventilation and respiratory function stable Cardiovascular status: blood pressure returned to baseline and stable Postop Assessment: no signs of nausea or vomiting Anesthetic complications: no   No notable events documented.   Last Vitals:  Vitals:   06/07/21 0945 06/07/21 0954  BP: 102/70 (!) 110/58  Pulse: 64 98  Resp: 13 18  Temp: 36.7 C (!) 36.3 C  SpO2: 98% 100%    Last Pain:  Vitals:   06/07/21 0954  TempSrc: Temporal  PainSc: 1                  Levenia Skalicky

## 2021-06-07 NOTE — Discharge Instructions (Addendum)
Laparoscopic Tubal Removal for Ectopic Pregnancy Discharge Instructions  Laparoscopic tubal removal is a procedure that removes the fallopian tube containing the ectopic pregnancy.  For the next three days, take ibuprofen and acetaminophen on a schedule, every 8 hours. You can take them together or you can intersperse them, and take one every four hours. I also gave you gabapentin for nighttime, to help you sleep and also to control pain. Take gabapentin medicines at night for at least the next 3 nights. You also have a narcotic, oxycodone, to take as needed if the above medicines don't help.  Postop constipation is a major cause of pain. Stay well hydrated, walk as you tolerate, and take over the counter senna as well as stool softeners if you need them.  RISKS AND COMPLICATIONS  Infection. Bleeding. Injury to surrounding organs. Anesthetic side effects. Failure of the procedure. Risks of future ectopic pregnancy on the other side PROCEDURE  You may be given a medicine to help you relax (sedative) before the procedure. You will be given a medicine to make you sleep (general anesthetic) during the procedure. A tube will be put down your throat to help your breath while under general anesthesia. Two small cuts (incisions) are made in the lower abdominal area and one incision is made near the belly button. Your abdominal area will be inflated with a safe gas (carbon dioxide). This helps give the surgeon room to operate, visualize, and helps the surgeon avoid other organs. A thin, lighted tube (laparoscope) with a camera attached is inserted into your abdomen through the incision near the belly button. Other small instruments are also inserted through the other abdominal incisions. The fallopian tube is located and are removed. After the fallopian tube is removed, the gas is released from the abdomen. The incisions will be closed with stitches (sutures), and Dermabond. A bandage may be  placed over the incisions. AFTER THE PROCEDURE  You will also have some mild abdominal discomfort for 3-7 days. You will be given pain medicine to ease any discomfort. As long as there are no problems, you may be allowed to go home. Someone will need to drive you home and be with you for at least 24 hours once home. You may have some mild discomfort in the throat. This is from the tube placed in your throat while you were sleeping. You may experience discomfort in the shoulder area from some trapped air between the liver and diaphragm. This sensation is normal and will slowly go away on its own. HOME CARE INSTRUCTIONS  Take all medicines as directed. Only take over-the-counter or prescription medicines for pain, discomfort, or fever as directed by your caregiver. Resume daily activities as directed. Showers are preferred over baths. You may resume sexual activities in 4-6 weeks Do not drive while taking narcotics. SEEK MEDICAL CARE IF: . There is increasing abdominal pain. You feel lightheaded or faint. You have the chills. You have an oral temperature above 102 F (38.9 C). There is pus-like (purulent) drainage from any of the wounds. You are unable to pass gas or have a bowel movement. You feel sick to your stomach (nauseous) or throw up (vomit). MAKE SURE YOU:  Understand these instructions. Will watch your condition. Will get help right away if you are not doing well or get worse.  ExitCare Patient Information 2013 [place], [place].    AMBULATORY SURGERY  DISCHARGE INSTRUCTIONS   The drugs that you were given will stay in your system  until tomorrow so for the next 24 hours you should not:  Drive an automobile Make any legal decisions Drink any alcoholic beverage   You may resume regular meals tomorrow.  Today it is better to start with liquids and gradually work up to solid foods.  You may eat anything you prefer, but it is better to start with liquids, then soup and  crackers, and gradually work up to solid foods.   Please notify your doctor immediately if you have any unusual bleeding, trouble breathing, redness and pain at the surgery site, drainage, fever, or pain not relieved by medication.    Your post-operative visit with Dr.                                       is: Date:                        Time:    Please call to schedule your post-operative visit.  Additional Instructions:

## 2021-06-07 NOTE — Transfer of Care (Signed)
Immediate Anesthesia Transfer of Care Note  Patient: Hannah Hogan  Procedure(s) Performed: LAPAROSCOPIC TUBAL LIGATION, CYSTOSCOPY (Bilateral)  Patient Location: PACU  Anesthesia Type:General  Level of Consciousness: awake, alert  and oriented  Airway & Oxygen Therapy: Patient Spontanous Breathing and Patient connected to face mask oxygen  Post-op Assessment: Report given to RN and Post -op Vital signs reviewed and stable  Post vital signs: Reviewed and stable  Last Vitals:  Vitals Value Taken Time  BP 109/56 06/07/21 0913  Temp    Pulse 81 06/07/21 0915  Resp 14 06/07/21 0915  SpO2 100 % 06/07/21 0915  Vitals shown include unvalidated device data.  Last Pain:  Vitals:   06/07/21 0635  TempSrc: Temporal  PainSc: 2          Complications: No notable events documented.

## 2021-06-07 NOTE — Op Note (Signed)
Hannah Hogan 06/07/2021  PREOPERATIVE DIAGNOSIS:  Undesired fertility  POSTOPERATIVE DIAGNOSIS:  Undesired fertility  PROCEDURE:  Laparoscopic Bilateral Tubal Sterilization using Bipolar Coagulation with partial salpingectomy, including fimbriae  - cystoscopy  ANESTHESIA:  General endotracheal  ANESTHESIOLOGIST: Alver Fisher, MD Anesthesiologist: Alver Fisher, MD CRNA: Hezzie Bump, CRNA  SURGEON: Cline Cools, MD  COMPLICATIONS:  None immediate.  ESTIMATED BLOOD LOSS:  30 ml.  FLUIDS: 1000 ml LR.  URINE OUTPUT:  50 ml of clear urine.  INDICATIONS: 25 y.o. [id number]  with undesired fertility, desires permanent sterilization. Other reversible forms of contraception were discussed with patient; she declines all other modalities.  Risks of procedure discussed with patient including permanence of method, bleeding, infection, injury to surrounding organs and need for additional procedures including laparotomy, risk of regret.  Failure risk of 0.5-1% with increased risk of ectopic gestation if pregnancy occurs was also discussed with patient.      FINDINGS:  Normal uterus, tubes, and ovaries.  TECHNIQUE:  The patient was taken to the operating room where general anesthesia was obtained without difficulty.  She was then placed in the dorsal lithotomy position and prepared and draped in sterile fashion.  The bladder was cathed for an estimated amount of clear urine. After an adequate timeout was performed, a bivalved speculum was then placed in the patient's vagina, and the anterior lip of cervix grasped with the single-tooth tenaculum.  The uterine manipulator was then advanced into the uterus.  The speculum was removed from the vagina.   Attention was then turned to the patient's abdomen where a 5-mm skin incision was made in the umbilical fold.  The Optiview 5-mm trocar and sleeve were then advanced without difficulty with the laparoscope under direct visualization  into the abdomen.  The abdomen was then insufflated with carbon dioxide gas and adequate pneumoperitoneum was obtained.  A survey of the patient's pelvis and abdomen revealed entirely normal anatomy.  However, the tip of the acorn was noted to be lateral to the cervix to the LEFT, and medial to the ureter. Attention was return to the vagina, where the acorn was noted to be poking through the lateral cervix at 7 o'clock. Under direct visualization from above and below, the acorn was carefully advanced into the cervix and as the cervix was minimally bleeding, a long allis was used to hold pressure in this area from the reminder of the case. Attention returned to the abdomen.  The fallopian tubes were observed and found to be normal in appearance. A 81mm port was placed in the left and right quadrants under direct visualization. A Ligasure device was then advanced through the operative port and used to coagulate and excise the distal portion of the Fallopian tube, including the fimbriated ends.  Good blanching and coagulation was noted at the site of the application.  There was no bleeding noted in the mesosalpinx.  A similar process was carried out on the right fallopian tube allowing for bilateral tubal sterilization.   Good hemostasis was noted overall. Pressure dropped and hemostasis confirmed. The instruments were then removed from the patient's abdomen and the skin was closed with Dermabond.    A cystoscopy was undertaken to confirm ureter integrity, with the bladder and ureters intact. Bilateral ureters with excellent efflux noted.  The uterine manipulator and the tenaculum were removed from the vagina with enough bleeding at the tenac site that a 0-vicryl figure of eight was placed with a UR5 needle for good hemostasis. This site is  where all the blood loss for the case occurred.   The patient tolerated the procedure well.  Sponge, lap, and needle counts were correct times two.  The patient was then  taken to the recovery room awake, extubated and in stable condition.

## 2021-06-08 ENCOUNTER — Encounter: Payer: Self-pay | Admitting: Obstetrics and Gynecology

## 2021-06-08 LAB — SURGICAL PATHOLOGY

## 2021-06-13 LAB — TYPE AND SCREEN
ABO/RH(D): O NEG
Antibody Screen: POSITIVE
